# Patient Record
Sex: Female | Born: 1980 | Race: Black or African American | Hispanic: No | Smoking: Current every day smoker
Health system: Southern US, Community
[De-identification: ages and names within clinical notes are randomized; demographics above are authoritative.]

## PROBLEM LIST (undated history)

## (undated) DIAGNOSIS — R51 Headache: Secondary | ICD-10-CM

## (undated) HISTORY — PX: MOUTH SURGERY: SHX715

---
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
## 2000-01-29 ENCOUNTER — Encounter: Payer: Self-pay | Admitting: Obstetrics and Gynecology

## 2000-01-29 ENCOUNTER — Ambulatory Visit (HOSPITAL_COMMUNITY): Admission: RE | Admit: 2000-01-29 | Discharge: 2000-01-29 | Payer: Self-pay | Admitting: Obstetrics and Gynecology

## 2000-01-30 ENCOUNTER — Encounter (HOSPITAL_COMMUNITY): Admission: RE | Admit: 2000-01-30 | Discharge: 2000-02-06 | Payer: Self-pay | Admitting: Obstetrics and Gynecology

## 2000-02-02 ENCOUNTER — Encounter: Payer: Self-pay | Admitting: Obstetrics and Gynecology

## 2000-02-06 ENCOUNTER — Inpatient Hospital Stay (HOSPITAL_COMMUNITY): Admission: AD | Admit: 2000-02-06 | Discharge: 2000-02-09 | Payer: Self-pay | Admitting: Obstetrics and Gynecology

## 2000-02-06 ENCOUNTER — Encounter: Payer: Self-pay | Admitting: Obstetrics and Gynecology

## 2000-02-06 ENCOUNTER — Encounter (INDEPENDENT_AMBULATORY_CARE_PROVIDER_SITE_OTHER): Payer: Self-pay

## 2000-03-08 ENCOUNTER — Other Ambulatory Visit: Admission: RE | Admit: 2000-03-08 | Discharge: 2000-03-08 | Payer: Self-pay | Admitting: Obstetrics and Gynecology

## 2000-07-22 ENCOUNTER — Inpatient Hospital Stay (HOSPITAL_COMMUNITY): Admission: AD | Admit: 2000-07-22 | Discharge: 2000-07-22 | Payer: Self-pay | Admitting: *Deleted

## 2002-02-06 ENCOUNTER — Emergency Department (HOSPITAL_COMMUNITY): Admission: EM | Admit: 2002-02-06 | Discharge: 2002-02-06 | Payer: Self-pay | Admitting: Emergency Medicine

## 2002-02-07 ENCOUNTER — Emergency Department (HOSPITAL_COMMUNITY): Admission: EM | Admit: 2002-02-07 | Discharge: 2002-02-07 | Payer: Self-pay | Admitting: Emergency Medicine

## 2002-09-19 ENCOUNTER — Inpatient Hospital Stay (HOSPITAL_COMMUNITY): Admission: AD | Admit: 2002-09-19 | Discharge: 2002-09-19 | Payer: Self-pay | Admitting: *Deleted

## 2003-05-23 ENCOUNTER — Emergency Department (HOSPITAL_COMMUNITY): Admission: EM | Admit: 2003-05-23 | Discharge: 2003-05-23 | Payer: Self-pay | Admitting: Emergency Medicine

## 2003-09-20 ENCOUNTER — Other Ambulatory Visit: Admission: RE | Admit: 2003-09-20 | Discharge: 2003-09-20 | Payer: Self-pay | Admitting: Obstetrics and Gynecology

## 2003-11-20 ENCOUNTER — Ambulatory Visit (HOSPITAL_COMMUNITY): Admission: RE | Admit: 2003-11-20 | Discharge: 2003-11-20 | Payer: Self-pay | Admitting: Obstetrics and Gynecology

## 2004-04-05 ENCOUNTER — Inpatient Hospital Stay (HOSPITAL_COMMUNITY): Admission: AD | Admit: 2004-04-05 | Discharge: 2004-04-07 | Payer: Self-pay | Admitting: Obstetrics and Gynecology

## 2005-06-15 ENCOUNTER — Inpatient Hospital Stay (HOSPITAL_COMMUNITY): Admission: AD | Admit: 2005-06-15 | Discharge: 2005-06-15 | Payer: Self-pay | Admitting: *Deleted

## 2005-10-19 ENCOUNTER — Inpatient Hospital Stay (HOSPITAL_COMMUNITY): Admission: AD | Admit: 2005-10-19 | Discharge: 2005-10-19 | Payer: Self-pay | Admitting: Obstetrics and Gynecology

## 2006-04-25 ENCOUNTER — Emergency Department (HOSPITAL_COMMUNITY): Admission: EM | Admit: 2006-04-25 | Discharge: 2006-04-25 | Payer: Self-pay | Admitting: Emergency Medicine

## 2006-07-12 ENCOUNTER — Inpatient Hospital Stay (HOSPITAL_COMMUNITY): Admission: AD | Admit: 2006-07-12 | Discharge: 2006-07-12 | Payer: Self-pay | Admitting: Gynecology

## 2007-12-06 ENCOUNTER — Emergency Department (HOSPITAL_COMMUNITY): Admission: EM | Admit: 2007-12-06 | Discharge: 2007-12-06 | Payer: Self-pay | Admitting: Family Medicine

## 2008-07-13 ENCOUNTER — Emergency Department (HOSPITAL_COMMUNITY): Admission: EM | Admit: 2008-07-13 | Discharge: 2008-07-13 | Payer: Self-pay | Admitting: Family Medicine

## 2009-02-15 ENCOUNTER — Inpatient Hospital Stay (HOSPITAL_COMMUNITY): Admission: AD | Admit: 2009-02-15 | Discharge: 2009-02-15 | Payer: Self-pay | Admitting: Family Medicine

## 2009-03-11 ENCOUNTER — Emergency Department (HOSPITAL_COMMUNITY): Admission: EM | Admit: 2009-03-11 | Discharge: 2009-03-11 | Payer: Self-pay | Admitting: Emergency Medicine

## 2010-05-04 LAB — POCT URINALYSIS DIP (DEVICE)
Bilirubin Urine: NEGATIVE
Glucose, UA: NEGATIVE mg/dL
Hgb urine dipstick: NEGATIVE
Ketones, ur: NEGATIVE mg/dL
Nitrite: NEGATIVE
Protein, ur: NEGATIVE mg/dL
Specific Gravity, Urine: 1.01 (ref 1.005–1.030)
Urobilinogen, UA: 1 mg/dL (ref 0.0–1.0)
pH: 7 (ref 5.0–8.0)

## 2010-05-19 LAB — URINALYSIS, ROUTINE W REFLEX MICROSCOPIC
Bilirubin Urine: NEGATIVE
Glucose, UA: NEGATIVE mg/dL
Hgb urine dipstick: NEGATIVE
Ketones, ur: NEGATIVE mg/dL
Nitrite: POSITIVE — AB
Protein, ur: NEGATIVE mg/dL
Specific Gravity, Urine: 1.015 (ref 1.005–1.030)
Urobilinogen, UA: 0.2 mg/dL (ref 0.0–1.0)
pH: 6 (ref 5.0–8.0)

## 2010-05-19 LAB — POCT PREGNANCY, URINE: Preg Test, Ur: NEGATIVE

## 2010-05-19 LAB — URINE MICROSCOPIC-ADD ON

## 2010-05-19 LAB — URINE CULTURE: Colony Count: >=100000

## 2010-05-19 LAB — GC/CHLAMYDIA PROBE AMP, URINE
Chlamydia, Swab/Urine, PCR: NEGATIVE
GC Probe Amp, Urine: NEGATIVE

## 2010-07-04 NOTE — Discharge Summary (Signed)
[facility]  Patient:    Sarah Klein, Sarah Klein                    MRN: [id number] Adm. Date:  [id number] Disc. Date: 02/08/00 Attending:  Oliver Pila                           Discharge Summary  DISCHARGE DIAGNOSES:          1. Preterm pregnancy at 36 weeks, delivered.                               2. Intrauterine growth restriction.                               3. Oligohydramnios.                               4. Status post normal spontaneous vaginal                                  delivery.  DISCHARGE MEDICATIONS:        1. Motrin 600 mg p.o. q.6h.                               2. Percocet one to two tablets p.o. q.4h. p.r.n.  DISCHARGE FOLLOW-UP:          Patient is to follow up in six weeks in the office for routine postpartum examination.  HOSPITAL COURSE:              Patient is a 30 year old G1, P0 who is admitted at 36 weeks even.  Estimated due date was March 05, 2000 by her LMP consistent with a first trimester ultrasound.  Patient was admitted for induction of labor given findings of oligohydramnios with a persistent AFI of 5 as well as IUGR less than the 10th percentile by ultrasound.  Patients NSTs had been reactive, however, her amniotic fluid index failed to improve in the past week and slightly decreased.  On admission she had good fetal movement and no vaginal bleeding.  Pregnancy had been complicated by poor compliance with appointments in the office secondary to social issues.  Pregnancy also was complicated by positive marijuana screen early in pregnancy.  PRENATAL LABORATORIES:        Blood type O+.  Antibody negative.  Sickle negative.  RPR negative.  Rubella immune.  Hepatitis B surface antigen negative.  HIV negative.  GC negative.  Chlamydia negative.  PAST OBSTETRICAL HISTORY:     None.  PAST GYNECOLOGICAL HISTORY:   She had a history of herpes in her partner, however, herself has had no outbreaks.  She had a  colposcopy for an abnormal Pap smear in March 2001 which was normal.  PAST SURGICAL HISTORY:        None.  PAST MEDICAL HISTORY:         None.  SOCIAL HISTORY:               Patient is a single female, although father of the baby is involved.  Just received housing through the housing authority. She was a smoker,  however, quit with pregnancy.  PHYSICAL EXAMINATION:  VITAL SIGNS:                  Afebrile with stable vital signs.  Fetal heart rate was reactive.  CARDIAC:                      Regular rate and rhythm.  ABDOMEN:                      Gravid and nontender.  PELVIC:                       Cervix was 50% effaced, 1 cm dilated, -3.                                She was admitted for Pitocin induction.  Patient did well and when she reached 2 cm had assist of rupture of membranes with clear fluid obtained.  She received an epidural anesthesia at approximately 4 cm and secondary to some variable decelerations as well as difficulty tracing contractions, had a fetal scalp electrode and IUPC placed.  With those in place the patient progressed well to complete and pushed well with a normal spontaneous vaginal delivery of a vigorous female infant over an intact perineum.  Apgars were 8 and 9.  Weight was 4 pounds 12 ounces.  Placenta delivered spontaneously.  There was a small left periclitoral/labial laceration which reapproximated with 3-0 Vicryl in interrupted sutures.  EBL was 300 cc.  Cervix and rectum were intact.  Patient was then admitted for routine postpartum care and did very well.  She remained afebrile.  On postpartum day #1 was doing very well, breast-feeding without difficulty.  The fundus was firm.  The pediatrician had given her the okay for discharge with the baby to follow up at pediatric office in the next few days. DD:  02/08/00 TD:  02/08/00 Job: [id number]

## 2010-07-04 NOTE — H&P (Signed)
Sarah Klein, GUARDIOLA NO.:  [id number]   MEDICAL RECORD NO.:  [id number]          PATIENT TYPE:  INP   LOCATION:  9163                          FACILITY:  [facility]   PHYSICIAN:  Hal Morales, M.D.DATE OF BIRTH:  1981/02/13   DATE OF ADMISSION:  04/05/2004  DATE OF DISCHARGE:                                HISTORY & PHYSICAL   HISTORY OF PRESENT ILLNESS:  This is a 30 year old gravida 3, para 1-0-1-1  at 39-1/7 weeks who presents in active labor, status post rupture of  membranes at 4 a.m. with clear fluid.  Her pregnancy has been followed by  the nurse midwife service and remarkable for:  #1 - History of  oligohydramnios, #2 - group B strep positive.   OBSTETRICAL HISTORY:  OB history is remarkable for vaginal delivery in 2001  of a female infant at 22 weeks' gestation weighing 4 pounds 12 ounces,  remarkable for oligohydramnios.  She had a spontaneous abortion in 2004 with  no complications.   MEDICAL HISTORY:  Medical history is remarkable for:  1.  Oligohydramnios with her first baby.  2.  Trichomonas in the first trimester of this pregnancy.  3.  Childhood varicella.  4.  History of migraines.   FAMILY HISTORY:  Family history is remarkable for a great-grandmother with  hypertension and diabetes and stroke.   SURGICAL HISTORY:  Negative.   GENETIC HISTORY:  Negative.   SOCIAL HISTORY:  The patient is single.  Father of the baby, Alfonse Flavors,  is involved and supportive.  She is a Consulting civil engineer.  She does not report a  religious affiliation.  She denies any alcohol, tobacco or drug use.   PRENATAL LABORATORIES:  Hemoglobin 11.8, platelets 234,000.  Blood type O  positive, antibody screen negative.  Sickle cell negative.  RPR nonreactive.  Rubella immune.  Hepatitis negative.  HIV negative.  Cystic fibrosis  declined.  Group B strep positive.   HISTORY OF CURRENT PREGNANCY:  The patient entered care at 10 weeks'  gestation.  She declined her quadruple  screen.  She had a normal ultrasound.  She was treated for a urinary tract infection at 16 weeks.  She had positive  group B strep at term.  Ultrasound at term showed 67-68 percentile growth  with normal AFI.   OBJECTIVE DATA:  VITAL SIGNS:  Stable, afebrile.  HEENT:  Within normal limits.  NECK:  Thyroid normal, not enlarged.  CHEST:  Clear to auscultation.  HEART:  Heart rate regular rate and rhythm.  ABDOMEN:  Abdomen gravid at 38 cm, vertex to Leopold's.  EFM shows  reassuring fetal heart rate with contractions every 2-4 minutes.  PELVIC:  There is gross pooling of clear fluid.  Cervix is 5.5 cm,  completely effaced and -1 station, vertex presentation.  EXTREMITIES:  Within normal limits.   ASSESSMENT:  1.  Intrauterine pregnancy at 39-1/7 weeks.  2.  Active labor.  3.  Spontaneous rupture of membranes.  4.  Group B streptococcus positive.   PLAN:  1.  Admit to birthing suite, Dr. [facility] notified.  2.  Routine  C.N.M. orders.  3.  Stadol and epidural.  4.  Group B strep prophylaxis.      MLW/MEDQ  D:  04/05/2004  T:  04/05/2004  Job:  [id number]

## 2010-07-04 NOTE — Discharge Summary (Signed)
[facility]  Patient:    Sarah Klein, Sarah Klein                    MRN: [id number] Adm. Date:  [id number] Disc. Date: [id number] Attending:  Oliver Pila                           Discharge Summary  ADDENDUM  This is an addendum to the previously dictated Discharge Summary dictated on December 23 by Alvino Chapel, M.D..  The patient was not discharged on December 23 as her baby was not stable for discharge; so the patient stayed in the hospital.  She had no significant complications, remained afebrile with stable vital signs, breast fed her baby without problems and, on the morning of December 24, was stable for discharge home.  On December 24, she is discharged home as per her previous Discharge Summary. DD:  02/09/00 TD:  02/10/00 Job: [id number]

## 2010-12-07 ENCOUNTER — Emergency Department (HOSPITAL_COMMUNITY)
Admission: EM | Admit: 2010-12-07 | Discharge: 2010-12-07 | Disposition: A | Discharge status: Other Acute Inpatient Hospital | Payer: No Typology Code available for payment source | Attending: Emergency Medicine | Admitting: Emergency Medicine

## 2010-12-07 DIAGNOSIS — IMO0002 Reserved for concepts with insufficient information to code with codable children: Secondary | ICD-10-CM | POA: Insufficient documentation

## 2010-12-07 DIAGNOSIS — N949 Unspecified condition associated with female genital organs and menstrual cycle: Secondary | ICD-10-CM | POA: Insufficient documentation

## 2010-12-08 LAB — POCT PREGNANCY, URINE: Preg Test, Ur: NEGATIVE

## 2011-12-26 ENCOUNTER — Emergency Department (HOSPITAL_COMMUNITY): Payer: Self-pay

## 2011-12-26 ENCOUNTER — Emergency Department (HOSPITAL_COMMUNITY)
Admission: EM | Admit: 2011-12-26 | Discharge: 2011-12-26 | Disposition: A | Discharge status: Home Health | Payer: Self-pay | Attending: Emergency Medicine | Admitting: Emergency Medicine

## 2011-12-26 ENCOUNTER — Encounter (HOSPITAL_COMMUNITY): Payer: Self-pay | Admitting: Physical Medicine and Rehabilitation

## 2011-12-26 DIAGNOSIS — J40 Bronchitis, not specified as acute or chronic: Secondary | ICD-10-CM | POA: Insufficient documentation

## 2011-12-26 DIAGNOSIS — F17200 Nicotine dependence, unspecified, uncomplicated: Secondary | ICD-10-CM | POA: Insufficient documentation

## 2011-12-26 LAB — RAPID STREP SCREEN (MED CTR MEBANE ONLY): Streptococcus, Group A Screen (Direct): NEGATIVE

## 2011-12-26 MED ORDER — ALBUTEROL SULFATE HFA 108 (90 BASE) MCG/ACT IN AERS
1.0000 | INHALATION_SPRAY | RESPIRATORY_TRACT | Status: DC | PRN
  Administered 2011-12-26: 1 via RESPIRATORY_TRACT
  Filled 2011-12-26: qty 6.7

## 2011-12-26 MED ORDER — ALBUTEROL SULFATE (5 MG/ML) 0.5% IN NEBU
5.0000 mg | INHALATION_SOLUTION | Freq: Once | RESPIRATORY_TRACT | Status: AC
  Administered 2011-12-26: 5 mg via RESPIRATORY_TRACT
  Filled 2011-12-26: qty 1

## 2011-12-26 MED ORDER — IPRATROPIUM BROMIDE 0.02 % IN SOLN
0.5000 mg | Freq: Once | RESPIRATORY_TRACT | Status: AC
  Administered 2011-12-26: 0.5 mg via RESPIRATORY_TRACT
  Filled 2011-12-26: qty 2.5

## 2011-12-26 NOTE — ED Notes (Signed)
Patient transported to X-ray 

## 2011-12-26 NOTE — ED Notes (Signed)
Pt states she feels "much better" 

## 2011-12-26 NOTE — ED Provider Notes (Addendum)
History    This chart was scribed for Sarah Sprout, MD, MD by Smitty Pluck, ED Scribe. The patient was seen in room TR07C and the patient's care was started at 12:52PM.   CSN: [id number]  Arrival date & time 12/26/11  1222     Chief Complaint  Patient presents with  . Cough  . Shortness of Breath    (Consider location/radiation/quality/duration/timing/severity/associated sxs/prior treatment) Patient is a 31 y.o. female presenting with cough and shortness of breath. The history is provided by the patient. No language interpreter was used.  Cough Associated symptoms include shortness of breath. Pertinent negatives include no chills.  Shortness of Breath  Associated symptoms include cough and shortness of breath. Pertinent negatives include no fever.   [name] is a 31 y.o. female who presents to the Emergency Department complaining of constant, moderate non-productive cough onset 2 days ago. Pt reports having wheezing that keeps her awake at night. She states that she has chest congestion. Denies fever and any other pain. Pt smokes cigarettes.   No past medical history on file.  No past surgical history on file.  No family history on file.  History  Substance Use Topics  . Smoking status: Current Every Day Smoker    Types: Cigarettes  . Smokeless tobacco: Not on file  . Alcohol Use: Yes     Comment: occ    OB History    Grav Para Term Preterm Abortions TAB SAB Ect Mult Living                  Review of Systems  Constitutional: Negative for fever and chills.  Respiratory: Positive for cough and shortness of breath.   Gastrointestinal: Negative for nausea and vomiting.  Neurological: Negative for weakness.  All other systems reviewed and are negative.    Allergies  Review of patient's allergies indicates no known allergies.  Home Medications  No current outpatient prescriptions on file.  BP 142/78  Pulse 101  Temp 98.5 F (36.9 C) (Oral)  Resp  18  SpO2 97%  Physical Exam  Nursing note and vitals reviewed. Constitutional: She is oriented to person, place, and time. She appears well-developed and well-nourished. No distress.  HENT:  Head: Normocephalic and atraumatic.  Right Ear: External ear normal.  Left Ear: External ear normal.  Mouth/Throat: Oropharynx is clear and moist.  Eyes: EOM are normal.  Neck: Neck supple. No tracheal deviation present.  Cardiovascular: Tachycardia present.   Pulmonary/Chest: Effort normal. No respiratory distress. She has wheezes (in all fields).  Musculoskeletal: Normal range of motion.  Neurological: She is alert and oriented to person, place, and time.  Skin: Skin is warm and dry.  Psychiatric: She has a normal mood and affect. Her behavior is normal.    ED Course  Procedures (including critical care time) DIAGNOSTIC STUDIES: Oxygen Saturation is 97% on room air, normal by my interpretation.    COORDINATION OF CARE: 12:53 PM Discussed ED treatment with pt     Labs Reviewed - No data to display Dg Chest 2 View  12/26/2011  *RADIOLOGY REPORT*  Clinical Data: Cough, shortness of breath  CHEST - 2 VIEW  Comparison: None.  Findings:  Cardiomediastinal silhouette is unremarkable.  No acute infiltrate or pleural effusion.  No pulmonary edema.  Bony thorax is unremarkable.  IMPRESSION: No active disease.   Original Report Authenticated By: Natasha Mead, M.D.      1. Bronchitis       MDM   Pt  with symptoms consistent with viral URI with wheezing.  Well appearing here.   No signs of pharyngitis, otitis or abnormal abdominal findings.  Albuterol/atrovent given for wheezing. CXR pending and will recheck.  2:40 PM CXR wnl.  D/ced home with inhaler    I personally performed the services described in this documentation, which was scribed in my presence.  The recorded information has been reviewed and considered.    Sarah Sprout, MD 12/26/11 1304  Sarah Sprout, MD 12/26/11  1441

## 2011-12-26 NOTE — ED Notes (Signed)
C/o non productive cough times 3 days with soreness in her chest

## 2011-12-26 NOTE — ED Notes (Signed)
Pt presents to department for evaluation of SOB, cough and chest congestion. Ongoing x2 days. Respirations unlabored. Denies fever. Denies pain at the time. She is alert and oriented x4. No signs of acute distress noted.

## 2011-12-26 NOTE — ED Notes (Signed)
States she feels better after [facility]

## 2011-12-26 NOTE — ED Notes (Signed)
Pt completing HHN.

## 2012-06-10 ENCOUNTER — Encounter (HOSPITAL_COMMUNITY): Payer: Self-pay | Admitting: Emergency Medicine

## 2012-06-10 ENCOUNTER — Emergency Department (HOSPITAL_COMMUNITY)
Admission: EM | Admit: 2012-06-10 | Discharge: 2012-06-11 | Disposition: A | Discharge status: Home / Self Care | Payer: Medicaid Other | Attending: Emergency Medicine | Admitting: Emergency Medicine

## 2012-06-10 DIAGNOSIS — Z202 Contact with and (suspected) exposure to infections with a predominantly sexual mode of transmission: Secondary | ICD-10-CM

## 2012-06-10 DIAGNOSIS — Z3202 Encounter for pregnancy test, result negative: Secondary | ICD-10-CM | POA: Insufficient documentation

## 2012-06-10 DIAGNOSIS — F17200 Nicotine dependence, unspecified, uncomplicated: Secondary | ICD-10-CM | POA: Insufficient documentation

## 2012-06-10 LAB — URINALYSIS, ROUTINE W REFLEX MICROSCOPIC
Bilirubin Urine: NEGATIVE
Glucose, UA: NEGATIVE mg/dL
Hgb urine dipstick: NEGATIVE
Ketones, ur: NEGATIVE mg/dL
Leukocytes, UA: NEGATIVE
Nitrite: POSITIVE — AB
Protein, ur: NEGATIVE mg/dL
Specific Gravity, Urine: 1.029 (ref 1.005–1.030)
Urobilinogen, UA: 0.2 mg/dL (ref 0.0–1.0)
pH: 5.5 (ref 5.0–8.0)

## 2012-06-10 LAB — POCT PREGNANCY, URINE: Preg Test, Ur: NEGATIVE

## 2012-06-10 LAB — URINE MICROSCOPIC-ADD ON

## 2012-06-10 MED ORDER — CEFTRIAXONE SODIUM 250 MG IJ SOLR
250.0000 mg | Freq: Once | INTRAMUSCULAR | Status: AC
  Administered 2012-06-11: 250 mg via INTRAMUSCULAR
  Filled 2012-06-10: qty 250

## 2012-06-10 MED ORDER — AZITHROMYCIN 250 MG PO TABS
1000.0000 mg | ORAL_TABLET | Freq: Once | ORAL | Status: AC
  Administered 2012-06-11: 1000 mg via ORAL
  Filled 2012-06-10: qty 4

## 2012-06-10 NOTE — ED Notes (Signed)
Pt states she is here with female partner. Partner admitted to her he is having penile discharge. Pt would like to be checked for UTI, pt states she experienced L flank pain yesterday. Denies GYN or urinary s/s.

## 2012-06-10 NOTE — ED Provider Notes (Signed)
History  This chart was scribed for non-physician practitioner Jaci Carrel, PA-C working with Celene Kras, MD, by Candelaria Stagers, ED Scribe. This patient was seen in room WTR5/WTR5 and the patient's care was started at 11:18 PM   CSN: [id number]  Arrival date & time 06/10/12  2243   First MD Initiated Contact with Patient 06/10/12 2313      Chief Complaint  Patient presents with  . Exposure to STD    The history is provided by the patient. No language interpreter was used.   Sarah Klein is a 32 y.o. female who presents to the Emergency Department pt reports unprotected intercourse with pt who likely has STD (was treated for yellow/green penile discharge).  Sexual partner is being treated for gonorrhea and chlamydia.  Pt denies any current sx including abdominal pain, back pain, discharge, dysuria, nausea, vomiting, or dyspareunia   History reviewed. No pertinent past medical history.  History reviewed. No pertinent past surgical history.  No family history on file.  History  Substance Use Topics  . Smoking status: Current Every Day Smoker    Types: Cigarettes  . Smokeless tobacco: Not on file  . Alcohol Use: Yes     Comment: occ    OB History   Grav Para Term Preterm Abortions TAB SAB Ect Mult Living                  Review of Systems ROS as mentioned in HPI.   Allergies  Review of patient's allergies indicates no known allergies.  Home Medications   Current Outpatient Rx  Name  Route  Sig  Dispense  Refill  . acetaminophen (TYLENOL) 500 MG tablet   Oral   Take 1,000 mg by mouth every 6 (six) hours as needed for pain (pain).           BP 127/65  Pulse 83  Temp(Src) 98.2 F (36.8 C) (Oral)  Resp 18  Ht 5\' 5"  (1.651 m)  Wt 126 lb (57.153 kg)  BMI 20.97 kg/m2  SpO2 100%  LMP 05/20/2012  Physical Exam  Nursing note and vitals reviewed. Constitutional: She is oriented to person, place, and time. She appears well-developed and well-nourished. No  distress.  HENT:  Head: Normocephalic and atraumatic.  Eyes: Conjunctivae and EOM are normal.  Neck: Normal range of motion.  Pulmonary/Chest: Effort normal.  Abdominal:  Soft nontender abdomen   Genitourinary:  Pelvic exam deferred  Musculoskeletal: Normal range of motion.  Neurological: She is alert and oriented to person, place, and time.  Skin: Skin is warm and dry. No rash noted. She is not diaphoretic.  Psychiatric: She has a normal mood and affect. Her behavior is normal.    ED Course  Procedures  DIAGNOSTIC STUDIES: Oxygen Saturation is 100% on room air, normal by my interpretation.    COORDINATION OF CARE:  11:19 PM Discussed course of care with pt which includes treatment for gonorrhea and chlamydia.  Pt understands and agrees.    Labs Reviewed  URINALYSIS, ROUTINE W REFLEX MICROSCOPIC  POCT PREGNANCY, URINE   No results found.   No diagnosis found.    MDM  STD interaction Patient to be discharged with instructions to follow up with OBGYN. Discussed importance of using protection when sexually active. Pt understands that they have GC/Chlamydia cultures pending and that they will need to inform all sexual partners if results return positive. Pt has been treated prophylacticly with azithromycin and rocephin due to pts history.  Pelvic  deferred.  Pt has been advised to not drink alcohol while on this medication.   I personally performed the services described in this documentation, which was scribed in my presence. The recorded information has been reviewed and is accurate.           Jaci Carrel, [place] 06/10/12 2330

## 2012-06-11 NOTE — ED Provider Notes (Signed)
Medical screening examination/treatment/procedure(s) were performed by non-physician practitioner and as supervising physician I was immediately available for consultation/collaboration.     Celene Kras, MD 06/11/12 [phone]

## 2012-11-10 ENCOUNTER — Encounter (HOSPITAL_COMMUNITY): Payer: Self-pay | Admitting: *Deleted

## 2012-11-10 ENCOUNTER — Inpatient Hospital Stay (HOSPITAL_COMMUNITY)
Admission: AD | Admit: 2012-11-10 | Discharge: 2012-11-10 | Disposition: A | Discharge status: Home / Self Care | Payer: Medicaid Other | Source: Ambulatory Visit | Attending: Obstetrics & Gynecology | Admitting: Obstetrics & Gynecology

## 2012-11-10 DIAGNOSIS — Z3201 Encounter for pregnancy test, result positive: Secondary | ICD-10-CM | POA: Insufficient documentation

## 2012-11-10 HISTORY — DX: Headache: R51

## 2012-11-10 LAB — POCT PREGNANCY, URINE: Preg Test, Ur: POSITIVE — AB

## 2012-11-10 NOTE — MAU Provider Note (Signed)
History     CSN: [id number]  Arrival date and time: 11/10/12 0825   None     Chief Complaint  Patient presents with  . Possible Pregnancy   HPI Pt is pregnant- took a home pregnancy test last night .  Pt has dental surgery and is taking Amoxicillin. She denies spotting bleeding or cramping.  Pt is not sure where she wants to go for prenatal care.  Pt denies nausea or vomiting.  Pt needs confirmation of pregnancy RN note:Pt had some dental procedures and a dental surgery before she knew she was pregnant and is concerned about her fetus; UPT was positive today and by LNMP, she would be [redacted] weeks gestation Patient states she had a positive home pregnancy test last night. Patient wants confirmation and to speak to someone about dental problems and her exposure to Xrays with the procedure. Has been on medication. Last period was 8-7 but had 2 days of light bleeding on 9-18.Patient denies bleeding, discharge, pain, nausea or vomiting   Past Medical History  Diagnosis Date  . [id number](478.2)     Past Surgical History  Procedure Laterality Date  . Mouth surgery      Family History  Problem Relation Age of Onset  . Diabetes Maternal Grandmother     History  Substance Use Topics  . Smoking status: Current Every Day Smoker -- 1.00 packs/day    Types: Cigarettes  . Smokeless tobacco: Not on file  . Alcohol Use: Yes     Comment: occ    Allergies: No Known Allergies  Prescriptions prior to admission  Medication Sig Dispense Refill  . amoxicillin (AMOXIL) 500 MG capsule Take 500 mg by mouth 3 (three) times daily. X 7 days      . HYDROcodone-acetaminophen (NORCO/VICODIN) 5-325 MG per tablet Take 1 tablet by mouth every 4 (four) hours as needed for pain.        Review of Systems  Constitutional: Negative for fever and chills.  Gastrointestinal: Negative for heartburn, vomiting, abdominal pain and diarrhea.  Genitourinary: Negative for dysuria and urgency.   Physical Exam    Blood pressure 118/72, pulse 77, temperature 98.3 F (36.8 C), temperature source Oral, resp. rate 18, height 5\' 6"  (1.676 m), weight 58.242 kg (128 lb 6.4 oz), last menstrual period 09/22/2012.  Physical Exam  Nursing note and vitals reviewed. Constitutional: She is oriented to person, place, and time. She appears well-developed and well-nourished.  HENT:  Head: Normocephalic.  Eyes: Pupils are equal, round, and reactive to light.  Neck: Normal range of motion. Neck supple.  Respiratory: Effort normal.  GI: Soft.  Musculoskeletal: Normal range of motion.  Neurological: She is alert and oriented to person, place, and time.  Skin: Skin is warm and dry.  Psychiatric: She has a normal mood and affect.    MAU Course  Procedures Results for orders placed during the hospital encounter of 11/10/12 (from the past 72 hour(s))  POCT PREGNANCY, URINE     Status: Abnormal   Collection Time    11/10/12  9:05 AM      Result Value Range   Preg Test, Ur POSITIVE (*) NEGATIVE   Comment:            THE SENSITIVITY OF THIS     METHODOLOGY IS >24 mIU/mL   Told pt she may continue Amoxicillin for dental   Assessment and Plan  Positive pregnancy test ABCs of pregnancy Confirmation of pregnancy F/u for OB care Return sooner if bleeding  or cramping  Bartlomiej Jenkinson 11/10/2012, 9:32 AM

## 2012-11-10 NOTE — MAU Note (Signed)
Patient states she had a positive home pregnancy test last night. Patient wants confirmation and to speak to someone about dental problems and her exposure to Xrays with the procedure. Has been on medication. Last period was 8-7 but had 2 days of light bleeding on 9-18.Patient denies bleeding, discharge, pain, nausea or vomiting.

## 2012-11-10 NOTE — Discharge Instructions (Signed)
ABCs of Pregnancy A Antepartum care is very important. Be sure you see your doctor and get prenatal care as soon as you think you are pregnant. At this time, you will be tested for infection, genetic abnormalities and potential problems with you and the pregnancy. This is the time to discuss diet, exercise, work, medications, labor, pain medication during labor and the possibility of a cesarean delivery. Ask any questions that may concern you. It is important to see your doctor regularly throughout your pregnancy. Avoid exposure to toxic substances and chemicals - such as cleaning solvents, lead and mercury, some insecticides, and paint. Pregnant women should avoid exposure to paint fumes, and fumes that cause you to feel ill, dizzy or faint. When possible, it is a good idea to have a pre-pregnancy consultation with your caregiver to begin some important recommendations your caregiver suggests such as, taking folic acid, exercising, quitting smoking, avoiding alcoholic beverages, etc. B Breastfeeding is the healthiest choice for both you and your baby. It has many nutritional benefits for the baby and health benefits for the mother. It also creates a very tight and loving bond between the baby and mother. Talk to your doctor, your family and friends, and your employer about how you choose to feed your baby and how they can support you in your decision. Not all birth defects can be prevented, but a woman can take actions that may increase her chance of having a healthy baby. Many birth defects happen very early in pregnancy, sometimes before a woman even knows she is pregnant. Birth defects or abnormalities of any child in your or the father's family should be discussed with your caregiver. Get a good support bra as your breast size changes. Wear it especially when you exercise and when nursing.  C Celebrate the news of your pregnancy with the your spouse/father and family. Childbirth classes are helpful to  take for you and the spouse/father because it helps to understand what happens during the pregnancy, labor and delivery. Cesarean delivery should be discussed with your doctor so you are prepared for that possibility. The pros and cons of circumcision if it is a boy, should be discussed with your pediatrician. Cigarette smoking during pregnancy can result in low birth weight babies. It has been associated with infertility, miscarriages, tubal pregnancies, infant death (mortality) and poor health (morbidity) in childhood. Additionally, cigarette smoking may cause long-term learning disabilities. If you smoke, you should try to quit before getting pregnant and not smoke during the pregnancy. Secondary smoke may also harm a mother and her developing baby. It is a good idea to ask people to stop smoking around you during your pregnancy and after the baby is born. Extra calcium is necessary when you are pregnant and is found in your prenatal vitamin, in dairy products, green leafy vegetables and in calcium supplements. D A healthy diet according to your current weight and height, along with vitamins and mineral supplements should be discussed with your caregiver. Domestic abuse or violence should be made known to your doctor right away to get the situation corrected. Drink more water when you exercise to keep hydrated. Discomfort of your back and legs usually develops and progresses from the middle of the second trimester through to delivery of the baby. This is because of the enlarging baby and uterus, which may also affect your balance. Do not take illegal drugs. Illegal drugs can seriously harm the baby and you. Drink extra fluids (water is best) throughout pregnancy to help  your body keep up with the increases in your blood volume. Drink at least 6 to 8 glasses of water, fruit juice, or milk each day. A good way to know you are drinking enough fluid is when your urine looks almost like clear water or is very light  yellow.  E Eat healthy to get the nutrients you and your unborn baby need. Your meals should include the five basic food groups. Exercise (30 minutes of light to moderate exercise a day) is important and encouraged during pregnancy, if there are no medical problems or problems with the pregnancy. Exercise that causes discomfort or dizziness should be stopped and reported to your caregiver. Emotions during pregnancy can change from being ecstatic to depression and should be understood by you, your partner and your family. F Fetal screening with ultrasound, amniocentesis and monitoring during pregnancy and labor is common and sometimes necessary. Take 400 micrograms of folic acid daily both before, when possible, and during the first few months of pregnancy to reduce the risk of birth defects of the brain and spine. All women who could possibly become pregnant should take a vitamin with folic acid, every day. It is also important to eat a healthy diet with fortified foods (enriched grain products, including cereals, rice, breads, and pastas) and foods with natural sources of folate (orange juice, green leafy vegetables, beans, peanuts, broccoli, asparagus, peas, and lentils). The father should be involved with all aspects of the pregnancy including, the prenatal care, childbirth classes, labor, delivery, and postpartum time. Fathers may also have emotional concerns about being a father, financial needs, and raising a family. G Genetic testing should be done appropriately. It is important to know your family and the father's history. If there have been problems with pregnancies or birth defects in your family, report these to your doctor. Also, genetic counselors can talk with you about the information you might need in making decisions about having a family. You can call a major medical center in your area for help in finding a board-certified genetic counselor. Genetic testing and counseling should be done  before pregnancy when possible, especially if there is a history of problems in the mother's or father's family. Certain ethnic backgrounds are more at risk for genetic defects. H Get familiar with the hospital where you will be having your baby. Get to know how long it takes to get there, the labor and delivery area, and the hospital procedures. Be sure your medical insurance is accepted there. Get your home ready for the baby including, clothes, the baby's room (when possible), furniture and car seat. Hand washing is important throughout the day, especially after handling raw meat and poultry, changing the baby's diaper or using the bathroom. This can help prevent the spread of many bacteria and viruses that cause infection. Your hair may become dry and thinner, but will return to normal a few weeks after the baby is born. Heartburn is a common problem that can be treated by taking antacids recommended by your caregiver, eating smaller meals 5 or 6 times a day, not drinking liquids when eating, drinking between meals and raising the head of your bed 2 to 3 inches. I Insurance to cover you, the baby, doctor and hospital should be reviewed so that you will be prepared to pay any costs not covered by your insurance plan. If you do not have medical insurance, there are usually clinics and services available for you in your community. Take 30 milligrams of iron during  your pregnancy as prescribed by your doctor to reduce the risk of low red blood cells (anemia) later in pregnancy. All women of childbearing age should eat a diet rich in iron. J There should be a joint effort for the mother, father and any other children to adapt to the pregnancy financially, emotionally, and psychologically during the pregnancy. Join a support group for moms-to-be. Or, join a class on parenting or childbirth. Have the family participate when possible. K Know your limits. Let your caregiver know if you experience any of the  following:   Pain of any kind.  Strong cramps.  You develop a lot of weight in a short period of time (5 pounds in 3 to 5 days).  Vaginal bleeding, leaking of amniotic fluid.  Headache, vision problems.  Dizziness, fainting, shortness of breath.  Chest pain.  Fever of 102 F (38.9 C) or higher.  Gush of clear fluid from your vagina.  Painful urination.  Domestic violence.  Irregular heartbeat (palpitations).  Rapid beating of the heart (tachycardia).  Constant feeling sick to your stomach (nauseous) and vomiting.  Trouble walking, fluid retention (edema).  Muscle weakness.  If your baby has decreased activity.  Persistent diarrhea.  Abnormal vaginal discharge.  Uterine contractions at 20-minute intervals.  Back pain that travels down your leg. L Learn and practice that what you eat and drink should be in moderation and healthy for you and your baby. Legal drugs such as alcohol and caffeine are important issues for pregnant women. There is no safe amount of alcohol a woman can drink while pregnant. Fetal alcohol syndrome, a disorder characterized by growth retardation, facial abnormalities, and central nervous system dysfunction, is caused by a woman's use of alcohol during pregnancy. Caffeine, found in tea, coffee, soft drinks and chocolate, should also be limited. Be sure to read labels when trying to cut down on caffeine during pregnancy. More than 200 foods, beverages, and over-the-counter medications contain caffeine and have a high salt content! There are coffees and teas that do not contain caffeine. M Medical conditions such as diabetes, epilepsy, and high blood pressure should be treated and kept under control before pregnancy when possible, but especially during pregnancy. Ask your caregiver about any medications that may need to be changed or adjusted during pregnancy. If you are currently taking any medications, ask your caregiver if it is safe to take them  while you are pregnant or before getting pregnant when possible. Also, be sure to discuss any herbs or vitamins you are taking. They are medicines, too! Discuss with your doctor all medications, prescribed and over-the-counter, that you are taking. During your prenatal visit, discuss the medications your doctor may give you during labor and delivery. N Never be afraid to ask your doctor or caregiver questions about your health, the progress of the pregnancy, family problems, stressful situations, and recommendation for a pediatrician, if you do not have one. It is better to take all precautions and discuss any questions or concerns you may have during your office visits. It is a good idea to write down your questions before you visit the doctor. O Over-the-counter cough and cold remedies may contain alcohol or other ingredients that should be avoided during pregnancy. Ask your caregiver about prescription, herbs or over-the-counter medications that you are taking or may consider taking while pregnant.  P Physical activity during pregnancy can benefit both you and your baby by lessening discomfort and fatigue, providing a sense of well-being, and increasing the likelihood of  early recovery after delivery. Light to moderate exercise during pregnancy strengthens the belly (abdominal) and back muscles. This helps improve posture. Practicing yoga, walking, swimming, and cycling on a stationary bicycle are usually safe exercises for pregnant women. Avoid scuba diving, exercise at high altitudes (over 3000 feet), skiing, horseback riding, contact sports, etc. Always check with your doctor before beginning any kind of exercise, especially during pregnancy and especially if you did not exercise before getting pregnant. Q Queasiness, stomach upset and morning sickness are common during pregnancy. Eating a couple of crackers or dry toast before getting out of bed. Foods that you normally love may make you feel sick to  your stomach. You may need to substitute other nutritious foods. Eating 5 or 6 small meals a day instead of 3 large ones may make you feel better. Do not drink with your meals, drink between meals. Questions that you have should be written down and asked during your prenatal visits. R Read about and make plans to baby-proof your home. There are important tips for making your home a safer environment for your baby. Review the tips and make your home safer for you and your baby. Read food labels regarding calories, salt and fat content in the food. S Saunas, hot tubs, and steam rooms should be avoided while you are pregnant. Excessive high heat may be harmful during your pregnancy. Your caregiver will screen and examine you for sexually transmitted diseases and genetic disorders during your prenatal visits. Learn the signs of labor. Sexual relations while pregnant is safe unless there is a medical or pregnancy problem and your caregiver advises against it. T Traveling long distances should be avoided especially in the third trimester of your pregnancy. If you do have to travel out of state, be sure to take a copy of your medical records and medical insurance plan with you. You should not travel long distances without seeing your doctor first. Most airlines will not allow you to travel after 36 weeks of pregnancy. Toxoplasmosis is an infection caused by a parasite that can seriously harm an unborn baby. Avoid eating undercooked meat and handling cat litter. Be sure to wear gloves when gardening. Tingling of the hands and fingers is not unusual and is due to fluid retention. This will go away after the baby is born. U Womb (uterus) size increases during the first trimester. Your kidneys will begin to function more efficiently. This may cause you to feel the need to urinate more often. You may also leak urine when sneezing, coughing or laughing. This is due to the growing uterus pressing against your bladder,  which lies directly in front of and slightly under the uterus during the first few months of pregnancy. If you experience burning along with frequency of urination or bloody urine, be sure to tell your doctor. The size of your uterus in the third trimester may cause a problem with your balance. It is advisable to maintain good posture and avoid wearing high heels during this time. An ultrasound of your baby may be necessary during your pregnancy and is safe for you and your baby. V Vaccinations are an important concern for pregnant women. Get needed vaccines before pregnancy. Center for Disease Control ([employer]com.brwww.cdc.gov) has clear guidelines for the use of vaccines during pregnancy. Review the list, be sure to discuss it with your doctor. Prenatal vitamins are helpful and healthy for you and the baby. Do not take extra vitamins except what is recommended. Taking too much of certain  vitamins can cause overdose problems. Continuous vomiting should be reported to your caregiver. Varicose veins may appear especially if there is a family history of varicose veins. They should subside after the delivery of the baby. Support hose helps if there is leg discomfort. W Being overweight or underweight during pregnancy may cause problems. Try to get within 15 pounds of your ideal weight before pregnancy. Remember, pregnancy is not a time to be dieting! Do not stop eating or start skipping meals as your weight increases. Both you and your baby need the calories and nutrition you receive from a healthy diet. Be sure to consult with your doctor about your diet. There is a formula and diet plan available depending on whether you are overweight or underweight. Your caregiver or nutritionist can help and advise you if necessary. X Avoid X-rays. If you must have dental work or diagnostic tests, tell your dentist or physician that you are pregnant so that extra care can be taken. X-rays should only be taken when the risks of not taking  them outweigh the risk of taking them. If needed, only the minimum amount of radiation should be used. When X-rays are necessary, protective lead shields should be used to cover areas of the body that are not being X-rayed. Y Your baby loves you. Breastfeeding your baby creates a loving and very close bond between the two of you. Give your baby a healthy environment to live in while you are pregnant. Infants and children require constant care and guidance. Their health and safety should be carefully watched at all times. After the baby is born, rest or take a nap when the baby is sleeping. Z Get your ZZZs. Be sure to get plenty of rest. Resting on your side as often as possible, especially on your left side is advised. It provides the best circulation to your baby and helps reduce swelling. Try taking a nap for 30 to 45 minutes in the afternoon when possible. After the baby is born rest or take a nap when the baby is sleeping. Try elevating your feet for that amount of time when possible. It helps the circulation in your legs and helps reduce swelling.  Most information courtesy of the CDC. Document Released: 02/02/2005 Document Revised: 04/27/2011 Document Reviewed: 10/17/2008 Exit[employer] Patient Information 2014 [place] [employer].    ________________________________________     To schedule your Maternity Eligibility Appointment, please call [phone].  When you arrive for your appointment you must bring the following items or information listed below.  Your appointment will be rescheduled if you do not have these items or are 15 minutes late. If currently receiving Medicaid, you MUST bring: 1. Medicaid Card 2. Social Security Card 3. Picture ID 4. Proof of Pregnancy 5. Verification of current address if the address on Medicaid card is incorrect "postmarked mail" If not receiving Medicaid, you MUST bring: 1. Social Security Card 2. Picture ID 3. Birth Certificate (if available) Passport or  *Green Card 4. Proof of Pregnancy 5. Verification of current address "postmarked mail" for each income presented. 6. Verification of insurance coverage, if any 7. Check stubs from each employer for the previous month (if unable to present check stub  for each week, we will accept check stub for the first and last week ill the same month.) If you can't locate check stubs, you must bring a letter from the employer(s) and it must have the following information on letterhead, typed, in English: o name of company o company telephone number  o how long been with the company, if less than one month o how much person earns per hour o how many hours per week work o the gross pay the person earned for the previous month If you are 32 years old or less, you do not have to bring proof of income unless you work or live with the father of the baby and at that time we will need proof of income from you and/or the father of the baby. Green Card recipients are eligible for Medicaid for Pregnant Women (MPW)

## 2012-11-10 NOTE — MAU Note (Signed)
Pt had some dental procedures and a dental surgery before she knew she was pregnant and is concerned about her fetus; UPT was positive today and by LNMP, she would be [redacted] weeks gestation;

## 2012-11-14 NOTE — MAU Provider Note (Signed)
Attestation of Attending Supervision of Advanced Practitioner (PA/CNM/NP): Evaluation and management procedures were performed by the Advanced Practitioner under my supervision and collaboration.  I have reviewed the Advanced Practitioner's note and chart, and I agree with the management and plan.  Marquise Wicke, MD, FACOG Attending Obstetrician & Gynecologist Faculty Practice, [facility]  

## 2012-11-28 ENCOUNTER — Other Ambulatory Visit: Payer: Self-pay | Admitting: Obstetrics & Gynecology

## 2012-11-28 ENCOUNTER — Ambulatory Visit (INDEPENDENT_AMBULATORY_CARE_PROVIDER_SITE_OTHER): Payer: Medicaid Other | Admitting: *Deleted

## 2012-11-28 ENCOUNTER — Encounter: Payer: Self-pay | Admitting: *Deleted

## 2012-11-28 DIAGNOSIS — Z3401 Encounter for supervision of normal first pregnancy, first trimester: Secondary | ICD-10-CM

## 2012-11-28 DIAGNOSIS — Z23 Encounter for immunization: Secondary | ICD-10-CM

## 2012-11-28 DIAGNOSIS — Z3480 Encounter for supervision of other normal pregnancy, unspecified trimester: Secondary | ICD-10-CM

## 2012-11-28 DIAGNOSIS — Z111 Encounter for screening for respiratory tuberculosis: Secondary | ICD-10-CM

## 2012-11-28 DIAGNOSIS — Z3481 Encounter for supervision of other normal pregnancy, first trimester: Secondary | ICD-10-CM

## 2012-11-28 NOTE — Progress Notes (Signed)
Paternal mother has Lupus

## 2012-11-28 NOTE — Patient Instructions (Signed)

## 2012-11-29 LAB — OBSTETRIC PANEL
Eosinophils Absolute: 0.4 10*3/uL (ref 0.0–0.7)
HCT: 34.4 % — ABNORMAL LOW (ref 36.0–46.0)
Hemoglobin: 11.4 g/dL — ABNORMAL LOW (ref 12.0–15.0)
Hepatitis B Surface Ag: NEGATIVE
Lymphocytes Relative: 24 % (ref 12–46)
Lymphs Abs: 2.1 10*3/uL (ref 0.7–4.0)
MCH: 29.2 pg (ref 26.0–34.0)
MCHC: 33.1 g/dL (ref 30.0–36.0)
Monocytes Relative: 10 % (ref 3–12)
Neutro Abs: 5.4 10*3/uL (ref 1.7–7.7)
Neutrophils Relative %: 61 % (ref 43–77)
RBC: 3.9 MIL/uL (ref 3.87–5.11)
Rh Type: POSITIVE
Rubella: 4.36 Index — ABNORMAL HIGH (ref <0.90)
WBC: 8.8 10*3/uL (ref 4.0–10.5)

## 2012-11-29 LAB — CYSTIC FIBROSIS DIAGNOSTIC STUDY

## 2012-11-29 LAB — GC/CHLAMYDIA PROBE AMP, URINE: Chlamydia, Swab/Urine, PCR: NEGATIVE

## 2012-11-29 LAB — VARICELLA ZOSTER ANTIBODY, IGG: Varicella IgG: 3806 Index — ABNORMAL HIGH (ref <135.00)

## 2012-11-30 ENCOUNTER — Telehealth: Payer: Self-pay | Admitting: *Deleted

## 2012-11-30 ENCOUNTER — Other Ambulatory Visit: Payer: Medicaid Other | Admitting: *Deleted

## 2012-11-30 DIAGNOSIS — Z0184 Encounter for antibody response examination: Secondary | ICD-10-CM

## 2012-11-30 LAB — CULTURE, OB URINE

## 2012-11-30 LAB — HEPATITIS B SURFACE ANTIBODY,QUALITATIVE: Hep B S Ab: NEGATIVE

## 2012-11-30 NOTE — Progress Notes (Signed)
Patient comes today to have tb test read.  TB site is clear.  Test is negative.

## 2012-11-30 NOTE — Telephone Encounter (Signed)
Patient needs hepatitis b surface antibody testing added to her prenatal profile for school purposes.

## 2012-12-12 ENCOUNTER — Encounter: Payer: Medicaid Other | Admitting: Obstetrics & Gynecology

## 2012-12-21 ENCOUNTER — Encounter: Payer: Medicaid Other | Admitting: Obstetrics and Gynecology

## 2012-12-23 ENCOUNTER — Other Ambulatory Visit (INDEPENDENT_AMBULATORY_CARE_PROVIDER_SITE_OTHER): Payer: Medicaid Other

## 2012-12-23 DIAGNOSIS — Z111 Encounter for screening for respiratory tuberculosis: Secondary | ICD-10-CM

## 2012-12-23 NOTE — Progress Notes (Signed)
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

## 2012-12-26 ENCOUNTER — Other Ambulatory Visit (HOSPITAL_COMMUNITY)
Admission: RE | Admit: 2012-12-26 | Discharge: 2012-12-26 | Disposition: A | Discharge status: Home / Self Care | Payer: Medicaid Other | Source: Ambulatory Visit | Attending: Obstetrics & Gynecology | Admitting: Obstetrics & Gynecology

## 2012-12-26 ENCOUNTER — Encounter: Payer: Self-pay | Admitting: Obstetrics & Gynecology

## 2012-12-26 ENCOUNTER — Ambulatory Visit (INDEPENDENT_AMBULATORY_CARE_PROVIDER_SITE_OTHER): Payer: Medicaid Other | Admitting: Obstetrics & Gynecology

## 2012-12-26 VITALS — BP 115/74 | Wt 123.0 lb

## 2012-12-26 DIAGNOSIS — Z3492 Encounter for supervision of normal pregnancy, unspecified, second trimester: Secondary | ICD-10-CM

## 2012-12-26 DIAGNOSIS — Z3490 Encounter for supervision of normal pregnancy, unspecified, unspecified trimester: Secondary | ICD-10-CM | POA: Insufficient documentation

## 2012-12-26 DIAGNOSIS — Z3480 Encounter for supervision of other normal pregnancy, unspecified trimester: Secondary | ICD-10-CM

## 2012-12-26 DIAGNOSIS — A599 Trichomoniasis, unspecified: Secondary | ICD-10-CM

## 2012-12-26 DIAGNOSIS — Z1151 Encounter for screening for human papillomavirus (HPV): Secondary | ICD-10-CM | POA: Insufficient documentation

## 2012-12-26 DIAGNOSIS — O219 Vomiting of pregnancy, unspecified: Secondary | ICD-10-CM

## 2012-12-26 DIAGNOSIS — Z113 Encounter for screening for infections with a predominantly sexual mode of transmission: Secondary | ICD-10-CM | POA: Insufficient documentation

## 2012-12-26 DIAGNOSIS — Z23 Encounter for immunization: Secondary | ICD-10-CM

## 2012-12-26 DIAGNOSIS — Z01419 Encounter for gynecological examination (general) (routine) without abnormal findings: Secondary | ICD-10-CM | POA: Insufficient documentation

## 2012-12-26 MED ORDER — TETANUS-DIPHTH-ACELL PERTUSSIS 5-2.5-18.5 LF-MCG/0.5 IM SUSP: 0.5000 mL | Freq: Once | INTRAMUSCULAR | Status: DC

## 2012-12-26 MED ORDER — PROMETHAZINE HCL 25 MG PO TABS: 25.0000 mg | ORAL_TABLET | Freq: Four times a day (QID) | ORAL | Status: DC | PRN

## 2012-12-26 MED ORDER — GLYCOPYRROLATE 2 MG PO TABS: 2.0000 mg | ORAL_TABLET | Freq: Three times a day (TID) | ORAL | Status: DC | PRN

## 2012-12-26 NOTE — Patient Instructions (Addendum)
Morning Sickness Morning sickness is when you feel sick to your stomach (nauseous) during pregnancy. This nauseous feeling may or may not come with vomiting. It often occurs in the morning but can be a problem any time of day. Morning sickness is most common during the first trimester, but it may continue throughout pregnancy. While morning sickness is unpleasant, it is usually harmless unless you develop severe and continual vomiting (hyperemesis gravidarum). This condition requires more intense treatment.  CAUSES  The cause of morning sickness is not completely known but seems to be related to normal hormonal changes that occur in pregnancy. RISK FACTORS You are at greater risk if you:  Experienced nausea or vomiting before your pregnancy.  Had morning sickness during a previous pregnancy.  Are pregnant with more than one baby, such as twins. TREATMENT  Do not use any medicines (prescription, over-the-counter, or herbal) for morning sickness without first talking to your health care provider. Your health care provider may prescribe or recommend:  Vitamin B6 supplements and Unisom tablets  Anti-nausea medicines such as Reglan, Phenergan and Zofran.  The herbal medicine ginger. HOME CARE INSTRUCTIONS   Only take over-the-counter or prescription medicines as directed by your health care provider.  Taking multivitamins before getting pregnant can prevent or decrease the severity of morning sickness in most women.   Eat a piece of dry toast or unsalted crackers before getting out of bed in the morning.   Eat five or six small meals a day.   Eat dry and bland foods (rice, baked potato). Foods high in carbohydrates are often helpful.  Do not drink liquids with your meals. Drink liquids between meals.   Avoid greasy, fatty, and spicy foods.   Get someone to cook for you if the smell of any food causes nausea and vomiting.   If you feel nauseous after taking prenatal vitamins,  take the vitamins at night or with a snack.  Snack on protein foods (nuts, yogurt, cheese) between meals if you are hungry.   Eat unsweetened gelatins for desserts.   Wearing an acupressure wristband (worn for sea sickness) may be helpful.   Acupuncture may be helpful.   Do not smoke.   Get a humidifier to keep the air in your house free of odors.   Get plenty of fresh air. SEEK MEDICAL CARE IF:   Your home remedies are not working, and you need medicine.  You feel dizzy or lightheaded.  You are losing weight. SEEK IMMEDIATE MEDICAL CARE IF:   You have persistent and uncontrolled nausea and vomiting.  You pass out (faint). Document Released: 03/26/2006 Document Revised: 10/05/2012 Document Reviewed: 07/20/2012 [facility] Patient Information 2014 [place], [place]. Thank you for enrolling in MyChart. Please follow the instructions below to securely access your online medical record. MyChart allows you to send messages to your doctor, view your test results, manage appointments, and more.   How Do I Sign Up? 1. In your Internet browser, go to [employer] and enter [email or web address]. 2. Click on the Sign Up Now link in the Sign In box. You will see the New Member Sign Up page. 3. Enter your MyChart Access Code exactly as it appears below. You will not need to use this code after you've completed the sign-up process. If you do not sign up before the expiration date, you must request a new code.  MyChart Access Code: [email or web address] Expires: 01/09/2013  8:52 AM  4. Enter your Social Security Number ([email or web address]) and Date of Birth (  mm/dd/yyyy) as indicated and click Submit. You will be taken to the next sign-up page. 5. Create a MyChart ID. This will be your MyChart login ID and cannot be changed, so think of one that is secure and easy to remember. 6. Create a MyChart password. You can change your password at any time. 7. Enter your Password Reset Question  and Answer. This can be used at a later time if you forget your password.  8. Enter your e-mail address. You will receive e-mail notification when new information is available in MyChart. 9. Click Sign Up. You can now view your medical record.   Additional Information Remember, MyChart is NOT to be used for urgent needs. For medical emergencies, dial 911.

## 2012-12-26 NOTE — Progress Notes (Signed)
PPD reading today is negative.

## 2012-12-26 NOTE — Progress Notes (Signed)
P=[phone] 

## 2012-12-26 NOTE — Progress Notes (Signed)
   Subjective:    Sarah Klein is a [id number] at [redacted]w[redacted]d being seen today for her first obstetrical visit.  Her obstetrical history is significant for two term SVDs. Patient does not intend to breast feed. Pregnancy history fully reviewed.  Patient reports nausea and vomiting.  Filed Vitals:   12/26/12 1457  BP: 115/74  Weight: 123 lb (55.792 kg)    HISTORY: OB History  Gravida Para Term Preterm AB SAB TAB Ectopic Multiple Living  3 2 2  0      2    # Outcome Date GA Lbr Len/2nd Weight Sex Delivery Anes PTL Lv  3 CUR           2 TRM 04/05/04 [redacted]w[redacted]d  7 lb 12 oz (3.515 kg) M  EPI  Y  1 TRM 02/07/00 [redacted]w[redacted]d  4 lb 2 oz (1.871 kg) F  EPI  Y     Past Medical History  Diagnosis Date  . [id number](914.7)    Past Surgical History  Procedure Laterality Date  . Mouth surgery     Family History  Problem Relation Age of Onset  . Diabetes Maternal Grandmother   . Migraines Mother      Exam    Uterus:     Pelvic Exam:    Perineum: No Hemorrhoids, Normal Perineum   Vulva: normal   Vagina:  normal mucosa, normal discharge   Cervix: no bleeding following Pap and no cervical motion tenderness   Adnexa: normal adnexa and no mass, fullness, tenderness   Bony Pelvis: average  System: Breast:  normal appearance, no masses or tenderness   Skin: normal coloration and turgor, no rashes   Neurologic: oriented, normal   Extremities: normal strength, tone, and muscle mass   HEENT PERRLA and extra ocular movement intact   Mouth/Teeth mucous membranes moist, pharynx normal without lesions and dental hygiene good   Neck supple and no masses   Cardiovascular: regular rate and rhythm   Respiratory:  appears well, vitals normal, no respiratory distress, acyanotic, normal RR, chest clear, no wheezing, crepitations, rhonchi, normal symmetric air entry   Abdomen: soft, non-tender; bowel sounds normal; no masses,  no organomegaly   Urinary: urethral meatus normal      Assessment:    Pregnancy:  [id number] Patient Active Problem List   Diagnosis Date Noted  . Supervision of normal pregnancy 12/26/2012        Plan:   Initial labs drawn. Prenatal vitamins. Recommended Unisom, Vitamin B6 and Phenergan as needed for nausea and vomiting Tdap vaccine given today as per patient preference for school purposes Problem list reviewed and updated. Genetic Screening discussed: Declines Ultrasound discussed; fetal survey: ordered. Follow up in 4 weeks.   Tereso Newcomer, MD 12/26/2012

## 2013-01-02 MED ORDER — METRONIDAZOLE 500 MG PO TABS: ORAL_TABLET | ORAL | Status: DC

## 2013-01-02 NOTE — Addendum Note (Signed)
Addended by: Jaynie Collins A on: 01/02/2013 07:54 AM   Modules accepted: Orders

## 2013-01-23 ENCOUNTER — Encounter: Payer: Medicaid Other | Admitting: Family Medicine

## 2013-01-30 ENCOUNTER — Ambulatory Visit (INDEPENDENT_AMBULATORY_CARE_PROVIDER_SITE_OTHER): Payer: Medicaid Other | Admitting: Family Medicine

## 2013-01-30 VITALS — BP 124/80 | Wt 131.0 lb

## 2013-01-30 DIAGNOSIS — Z3492 Encounter for supervision of normal pregnancy, unspecified, second trimester: Secondary | ICD-10-CM

## 2013-01-30 DIAGNOSIS — Z3480 Encounter for supervision of other normal pregnancy, unspecified trimester: Secondary | ICD-10-CM

## 2013-01-30 NOTE — Patient Instructions (Signed)
Second Trimester of Pregnancy The second trimester is from week 13 through week 28, months 4 through 6. The second trimester is often a time when you feel your best. Your body has also adjusted to being pregnant, and you begin to feel better physically. Usually, morning sickness has lessened or quit completely, you may have more energy, and you may have an increase in appetite. The second trimester is also a time when the fetus is growing rapidly. At the end of the sixth month, the fetus is about 9 inches long and weighs about 1 pounds. You will likely begin to feel the baby move (quickening) between 18 and 20 weeks of the pregnancy. BODY CHANGES Your body goes through many changes during pregnancy. The changes vary from woman to woman.   Your weight will continue to increase. You will notice your lower abdomen bulging out.  You may begin to get stretch marks on your hips, abdomen, and breasts.  You may develop headaches that can be relieved by medicines approved by your caregiver.  You may urinate more often because the fetus is pressing on your bladder.  You may develop or continue to have heartburn as a result of your pregnancy.  You may develop constipation because certain hormones are causing the muscles that push waste through your intestines to slow down.  You may develop hemorrhoids or swollen, bulging veins (varicose veins).  You may have back pain because of the weight gain and pregnancy hormones relaxing your joints between the bones in your pelvis and as a result of a shift in weight and the muscles that support your balance.  Your breasts will continue to grow and be tender.  Your gums may bleed and may be sensitive to brushing and flossing.  Dark spots or blotches (chloasma, mask of pregnancy) may develop on your face. This will likely fade after the baby is born.  A dark line from your belly button to the pubic area (linea nigra) may appear. This will likely fade after  the baby is born. WHAT TO EXPECT AT YOUR PRENATAL VISITS During a routine prenatal visit:  You will be weighed to make sure you and the fetus are growing normally.  Your blood pressure will be taken.  Your abdomen will be measured to track your baby's growth.  The fetal heartbeat will be listened to.  Any test results from the previous visit will be discussed. Your caregiver may ask you:  How you are feeling.  If you are feeling the baby move.  If you have had any abnormal symptoms, such as leaking fluid, bleeding, severe headaches, or abdominal cramping.  If you have any questions. Other tests that may be performed during your second trimester include:  Blood tests that check for:  Low iron levels (anemia).  Gestational diabetes (between 24 and 28 weeks).  Rh antibodies.  Urine tests to check for infections, diabetes, or protein in the urine.  An ultrasound to confirm the proper growth and development of the baby.  An amniocentesis to check for possible genetic problems.  Fetal screens for spina bifida and Down syndrome. HOME CARE INSTRUCTIONS   Avoid all smoking, herbs, alcohol, and unprescribed drugs. These chemicals affect the formation and growth of the baby.  Follow your caregiver's instructions regarding medicine use. There are medicines that are either safe or unsafe to take during pregnancy.  Exercise only as directed by your caregiver. Experiencing uterine cramps is a good sign to stop exercising.  Continue to eat regular,   healthy meals.  Wear a good support bra for breast tenderness.  Do not use hot tubs, steam rooms, or saunas.  Wear your seat belt at all times when driving.  Avoid raw meat, uncooked cheese, cat litter boxes, and soil used by cats. These carry germs that can cause birth defects in the baby.  Take your prenatal vitamins.  Try taking a stool softener (if your caregiver approves) if you develop constipation. Eat more high-fiber  foods, such as fresh vegetables or fruit and whole grains. Drink plenty of fluids to keep your urine clear or pale yellow.  Take warm sitz baths to soothe any pain or discomfort caused by hemorrhoids. Use hemorrhoid cream if your caregiver approves.  If you develop varicose veins, wear support hose. Elevate your feet for 15 minutes, 3 4 times a day. Limit salt in your diet.  Avoid heavy lifting, wear low heel shoes, and practice good posture.  Rest with your legs elevated if you have leg cramps or low back pain.  Visit your dentist if you have not gone yet during your pregnancy. Use a soft toothbrush to brush your teeth and be gentle when you floss.  A sexual relationship may be continued unless your caregiver directs you otherwise.  Continue to go to all your prenatal visits as directed by your caregiver. SEEK MEDICAL CARE IF:   You have dizziness.  You have mild pelvic cramps, pelvic pressure, or nagging pain in the abdominal area.  You have persistent nausea, vomiting, or diarrhea.  You have a bad smelling vaginal discharge.  You have pain with urination. SEEK IMMEDIATE MEDICAL CARE IF:   You have a fever.  You are leaking fluid from your vagina.  You have spotting or bleeding from your vagina.  You have severe abdominal cramping or pain.  You have rapid weight gain or loss.  You have shortness of breath with chest pain.  You notice sudden or extreme swelling of your face, hands, ankles, feet, or legs.  You have not felt your baby move in over an hour.  You have severe headaches that do not go away with medicine.  You have vision changes. Document Released: 01/27/2001 Document Revised: 10/05/2012 Document Reviewed: 04/05/2012 [employer] Patient Information 2014 [employer].  Breastfeeding Deciding to breastfeed is one of the best choices you can make for you and your baby. A change in hormones during pregnancy causes your breast tissue to grow and increases the  number and size of your milk ducts. These hormones also allow proteins, sugars, and fats from your blood supply to make breast milk in your milk-producing glands. Hormones prevent breast milk from being released before your baby is born as well as prompt milk flow after birth. Once breastfeeding has begun, thoughts of your baby, as well as his or her sucking or crying, can stimulate the release of milk from your milk-producing glands.  BENEFITS OF BREASTFEEDING For Your Baby  Your first milk (colostrum) helps your baby's digestive system function better.   There are antibodies in your milk that help your baby fight off infections.   Your baby has a lower incidence of asthma, allergies, and sudden infant death syndrome.   The nutrients in breast milk are better for your baby than infant formulas and are designed uniquely for your baby's needs.   Breast milk improves your baby's brain development.   Your baby is less likely to develop other conditions, such as childhood obesity, asthma, or type 2 diabetes mellitus.  For   You   Breastfeeding helps to create a very special bond between you and your baby.   Breastfeeding is convenient. Breast milk is always available at the correct temperature and costs nothing.   Breastfeeding helps to burn calories and helps you lose the weight gained during pregnancy.   Breastfeeding makes your uterus contract to its prepregnancy size faster and slows bleeding (lochia) after you give birth.   Breastfeeding helps to lower your risk of developing type 2 diabetes mellitus, osteoporosis, and breast or ovarian cancer later in life. SIGNS THAT YOUR BABY IS HUNGRY Early Signs of Hunger  Increased alertness or activity.  Stretching.  Movement of the head from side to side.  Movement of the head and opening of the mouth when the corner of the mouth or cheek is stroked (rooting).  Increased sucking sounds, smacking lips, cooing, sighing, or  squeaking.  Hand-to-mouth movements.  Increased sucking of fingers or hands. Late Signs of Hunger  Fussing.  Intermittent crying. Extreme Signs of Hunger Signs of extreme hunger will require calming and consoling before your baby will be able to breastfeed successfully. Do not wait for the following signs of extreme hunger to occur before you initiate breastfeeding:   Restlessness.  A loud, strong cry.   Screaming. BREASTFEEDING BASICS Breastfeeding Initiation  Find a comfortable place to sit or lie down, with your neck and back well supported.  Place a pillow or rolled up blanket under your baby to bring him or her to the level of your breast (if you are seated). Nursing pillows are specially designed to help support your arms and your baby while you breastfeed.  Make sure that your baby's abdomen is facing your abdomen.   Gently massage your breast. With your fingertips, massage from your chest wall toward your nipple in a circular motion. This encourages milk flow. You may need to continue this action during the feeding if your milk flows slowly.  Support your breast with 4 fingers underneath and your thumb above your nipple. Make sure your fingers are well away from your nipple and your baby's mouth.   Stroke your baby's lips gently with your finger or nipple.   When your baby's mouth is open wide enough, quickly bring your baby to your breast, placing your entire nipple and as much of the colored area around your nipple (areola) as possible into your baby's mouth.   More areola should be visible above your baby's upper lip than below the lower lip.   Your baby's tongue should be between his or her lower gum and your breast.   Ensure that your baby's mouth is correctly positioned around your nipple (latched). Your baby's lips should create a seal on your breast and be turned out (everted).  It is common for your baby to suck about 2 3 minutes in order to start the  flow of breast milk. Latching Teaching your baby how to latch on to your breast properly is very important. An improper latch can cause nipple pain and decreased milk supply for you and poor weight gain in your baby. Also, if your baby is not latched onto your nipple properly, he or she may swallow some air during feeding. This can make your baby fussy. Burping your baby when you switch breasts during the feeding can help to get rid of the air. However, teaching your baby to latch on properly is still the best way to prevent fussiness from swallowing air while breastfeeding. Signs that your baby has   successfully latched on to your nipple:    Silent tugging or silent sucking, without causing you pain.   Swallowing heard between every 3 4 sucks.    Muscle movement above and in front of his or her ears while sucking.  Signs that your baby has not successfully latched on to nipple:   Sucking sounds or smacking sounds from your baby while breastfeeding.  Nipple pain. If you think your baby has not latched on correctly, slip your finger into the corner of your baby's mouth to break the suction and place it between your baby's gums. Attempt breastfeeding initiation again. Signs of Successful Breastfeeding Signs from your baby:   A gradual decrease in the number of sucks or complete cessation of sucking.   Falling asleep.   Relaxation of his or her body.   Retention of a small amount of milk in his or her mouth.   Letting go of your breast by himself or herself. Signs from you:  Breasts that have increased in firmness, weight, and size 1 3 hours after feeding.   Breasts that are softer immediately after breastfeeding.  Increased milk volume, as well as a change in milk consistency and color by the 5th day of breastfeeding.   Nipples that are not sore, cracked, or bleeding. Signs That Your Baby is Getting Enough Milk  Wetting at least 3 diapers in a 24-hour period. The urine  should be clear and pale yellow by age 5 days.  At least 3 stools in a 24-hour period by age 5 days. The stool should be soft and yellow.  At least 3 stools in a 24-hour period by age 7 days. The stool should be seedy and yellow.  No loss of weight greater than 10% of birth weight during the first 3 days of age.  Average weight gain of 4 7 ounces (120 210 mL) per week after age 4 days.  Consistent daily weight gain by age 5 days, without weight loss after the age of 2 weeks. After a feeding, your baby may spit up a small amount. This is common. BREASTFEEDING FREQUENCY AND DURATION Frequent feeding will help you make more milk and can prevent sore nipples and breast engorgement. Breastfeed when you feel the need to reduce the fullness of your breasts or when your baby shows signs of hunger. This is called "breastfeeding on demand." Avoid introducing a pacifier to your baby while you are working to establish breastfeeding (the first 4 6 weeks after your baby is born). After this time you may choose to use a pacifier. Research has shown that pacifier use during the first year of a baby's life decreases the risk of sudden infant death syndrome (SIDS). Allow your baby to feed on each breast as long as he or she wants. Breastfeed until your baby is finished feeding. When your baby unlatches or falls asleep while feeding from the first breast, offer the second breast. Because newborns are often sleepy in the first few weeks of life, you may need to awaken your baby to get him or her to feed. Breastfeeding times will vary from baby to baby. However, the following rules can serve as a guide to help you ensure that your baby is properly fed:  Newborns (babies 4 weeks of age or younger) may breastfeed every 1 3 hours.  Newborns should not go longer than 3 hours during the day or 5 hours during the night without breastfeeding.  You should breastfeed your baby a minimum of   8 times in a 24-hour period until  you begin to introduce solid foods to your baby at around 6 months of age. BREAST MILK PUMPING Pumping and storing breast milk allows you to ensure that your baby is exclusively fed your breast milk, even at times when you are unable to breastfeed. This is especially important if you are going back to work while you are still breastfeeding or when you are not able to be present during feedings. Your lactation consultant can give you guidelines on how long it is safe to store breast milk.  A breast pump is a machine that allows you to pump milk from your breast into a sterile bottle. The pumped breast milk can then be stored in a refrigerator or freezer. Some breast pumps are operated by hand, while others use electricity. Ask your lactation consultant which type will work best for you. Breast pumps can be purchased, but some hospitals and breastfeeding support groups lease breast pumps on a monthly basis. A lactation consultant can teach you how to hand express breast milk, if you prefer not to use a pump.  CARING FOR YOUR BREASTS WHILE YOU BREASTFEED Nipples can become dry, cracked, and sore while breastfeeding. The following recommendations can help keep your breasts moisturized and healthy:  Avoid using soap on your nipples.   Wear a supportive bra. Although not required, special nursing bras and tank tops are designed to allow access to your breasts for breastfeeding without taking off your entire bra or top. Avoid wearing underwire style bras or extremely tight bras.  Air dry your nipples for 3 4minutes after each feeding.   Use only cotton bra pads to absorb leaked breast milk. Leaking of breast milk between feedings is normal.   Use lanolin on your nipples after breastfeeding. Lanolin helps to maintain your skin's normal moisture barrier. If you use pure lanolin you do not need to wash it off before feeding your baby again. Pure lanolin is not toxic to your baby. You may also hand express a  few drops of breast milk and gently massage that milk into your nipples and allow the milk to air dry. In the first few weeks after giving birth, some women experience extremely full breasts (engorgement). Engorgement can make your breasts feel heavy, warm, and tender to the touch. Engorgement peaks within 3 5 days after you give birth. The following recommendations can help ease engorgement:  Completely empty your breasts while breastfeeding or pumping. You may want to start by applying warm, moist heat (in the shower or with warm water-soaked hand towels) just before feeding or pumping. This increases circulation and helps the milk flow. If your baby does not completely empty your breasts while breastfeeding, pump any extra milk after he or she is finished.  Wear a snug bra (nursing or regular) or tank top for 1 2 days to signal your body to slightly decrease milk production.  Apply ice packs to your breasts, unless this is too uncomfortable for you.  Make sure that your baby is latched on and positioned properly while breastfeeding. If engorgement persists after 48 hours of following these recommendations, contact your health care provider or a lactation consultant. OVERALL HEALTH CARE RECOMMENDATIONS WHILE BREASTFEEDING  Eat healthy foods. Alternate between meals and snacks, eating 3 of each per day. Because what you eat affects your breast milk, some of the foods may make your baby more irritable than usual. Avoid eating these foods if you are sure that they are   negatively affecting your baby.  Drink milk, fruit juice, and water to satisfy your thirst (about 10 glasses a day).   Rest often, relax, and continue to take your prenatal vitamins to prevent fatigue, stress, and anemia.  Continue breast self-awareness checks.  Avoid chewing and smoking tobacco.  Avoid alcohol and drug use. Some medicines that may be harmful to your baby can pass through breast milk. It is important to ask your  health care provider before taking any medicine, including all over-the-counter and prescription medicine as well as vitamin and herbal supplements. It is possible to become pregnant while breastfeeding. If birth control is desired, ask your health care provider about options that will be safe for your baby. SEEK MEDICAL CARE IF:   You feel like you want to stop breastfeeding or have become frustrated with breastfeeding.  You have painful breasts or nipples.  Your nipples are cracked or bleeding.  Your breasts are red, tender, or warm.  You have a swollen area on either breast.  You have a fever or chills.  You have nausea or vomiting.  You have drainage other than breast milk from your nipples.  Your breasts do not become full before feedings by the 5th day after you give birth.  You feel sad and depressed.  Your baby is too sleepy to eat well.  Your baby is having trouble sleeping.   Your baby is wetting less than 3 diapers in a 24-hour period.  Your baby has less than 3 stools in a 24-hour period.  Your baby's skin or the white part of his or her eyes becomes yellow.   Your baby is not gaining weight by 5 days of age. SEEK IMMEDIATE MEDICAL CARE IF:   Your baby is overly tired (lethargic) and does not want to wake up and feed.  Your baby develops an unexplained fever. Document Released: 02/02/2005 Document Revised: 10/05/2012 Document Reviewed: 07/27/2012 [employer] Patient Information 2014 [employer].  

## 2013-01-30 NOTE — Progress Notes (Signed)
P - [phone] 

## 2013-01-30 NOTE — Assessment & Plan Note (Signed)
Continue routine prenatal care.  

## 2013-01-30 NOTE — Progress Notes (Signed)
Doing well Schedule anatomy u/s

## 2013-02-03 ENCOUNTER — Ambulatory Visit (HOSPITAL_COMMUNITY): Payer: Medicaid Other

## 2013-02-07 ENCOUNTER — Ambulatory Visit (HOSPITAL_COMMUNITY)
Admission: RE | Admit: 2013-02-07 | Discharge: 2013-02-07 | Disposition: A | Discharge status: Home / Self Care | Payer: Medicaid Other | Source: Ambulatory Visit | Attending: Obstetrics & Gynecology | Admitting: Obstetrics & Gynecology

## 2013-02-07 DIAGNOSIS — Z3492 Encounter for supervision of normal pregnancy, unspecified, second trimester: Secondary | ICD-10-CM

## 2013-02-07 DIAGNOSIS — Z363 Encounter for antenatal screening for malformations: Secondary | ICD-10-CM | POA: Insufficient documentation

## 2013-02-07 DIAGNOSIS — Z1389 Encounter for screening for other disorder: Secondary | ICD-10-CM | POA: Insufficient documentation

## 2013-02-07 DIAGNOSIS — O358XX0 Maternal care for other (suspected) fetal abnormality and damage, not applicable or unspecified: Secondary | ICD-10-CM | POA: Insufficient documentation

## 2013-02-08 ENCOUNTER — Encounter: Payer: Self-pay | Admitting: Obstetrics & Gynecology

## 2013-02-16 NOTE — L&D Delivery Note (Signed)
Delivery Note At 3:02 AM a viable female was delivered via Vaginal, Spontaneous Delivery (Presentation: Left Occiput Anterior).  APGAR: 7, 9; weight pending .   Placenta status: Intact, Manual removal.  Cord: 3 vessels with the following complications: None.  Cord pH: not done  27 min after the delivery of the infant, placenta was not delivered. Cervix noted to be closed. Dr. Shawnie PonsPratt called to the room to assist with placenta delivery. Placenta delivered via manual removal by Dr. Shawnie PonsPratt, complete and intact. Will send to pathology.   Anesthesia: Epidural  Episiotomy: None Lacerations: None Suture Repair: none  Est. Blood Loss (mL): 250  Mom to postpartum.  Baby to Couplet care / Skin to Skin.  Tawnya CrookHeather Donovan Hogan 06/07/2013, 3:39 AM

## 2013-02-27 ENCOUNTER — Encounter: Payer: Medicaid Other | Admitting: Obstetrics and Gynecology

## 2013-05-19 LAB — OB RESULTS CONSOLE GBS: GBS: NEGATIVE

## 2013-05-30 ENCOUNTER — Ambulatory Visit (INDEPENDENT_AMBULATORY_CARE_PROVIDER_SITE_OTHER): Payer: Medicaid Other | Admitting: Obstetrics & Gynecology

## 2013-05-30 ENCOUNTER — Encounter: Payer: Self-pay | Admitting: Obstetrics & Gynecology

## 2013-05-30 VITALS — BP 119/78 | Wt 146.0 lb

## 2013-05-30 DIAGNOSIS — Z3480 Encounter for supervision of other normal pregnancy, unspecified trimester: Secondary | ICD-10-CM

## 2013-05-30 DIAGNOSIS — Z3490 Encounter for supervision of normal pregnancy, unspecified, unspecified trimester: Secondary | ICD-10-CM

## 2013-05-30 LAB — OB RESULTS CONSOLE GC/CHLAMYDIA
CHLAMYDIA, DNA PROBE: NEGATIVE
GC PROBE AMP, GENITAL: NEGATIVE

## 2013-05-30 LAB — GLUCOSE, POCT (MANUAL RESULT ENTRY): POC Glucose: 81 mg/dl (ref 70–99)

## 2013-05-30 NOTE — Progress Notes (Signed)
Routine visit. Good FM. No problems. Labor precautions reviewed. Cervical cultures obtained today.

## 2013-05-30 NOTE — Addendum Note (Signed)
Addended by: Barbara CowerNOGUES, Katai Marsico L on: 05/30/2013 02:15 PM   Modules accepted: Orders

## 2013-05-30 NOTE — Progress Notes (Signed)
P- 74 . Lapse in care.  Having difficulty sleeping.

## 2013-05-31 LAB — GC/CHLAMYDIA PROBE AMP
CT Probe RNA: NEGATIVE
GC Probe RNA: NEGATIVE

## 2013-06-01 LAB — CULTURE, BETA STREP (GROUP B ONLY)

## 2013-06-02 ENCOUNTER — Encounter: Payer: Self-pay | Admitting: Obstetrics & Gynecology

## 2013-06-06 ENCOUNTER — Inpatient Hospital Stay (HOSPITAL_COMMUNITY): Payer: Medicaid Other

## 2013-06-06 ENCOUNTER — Inpatient Hospital Stay (HOSPITAL_COMMUNITY)
Admission: AD | Admit: 2013-06-06 | Discharge: 2013-06-09 | DRG: 774 | Disposition: A | Discharge status: Home / Self Care | Payer: Medicaid Other | Source: Ambulatory Visit | Attending: Family Medicine | Admitting: Family Medicine

## 2013-06-06 ENCOUNTER — Ambulatory Visit (INDEPENDENT_AMBULATORY_CARE_PROVIDER_SITE_OTHER): Payer: Medicaid Other | Admitting: Family Medicine

## 2013-06-06 ENCOUNTER — Inpatient Hospital Stay (HOSPITAL_COMMUNITY): Payer: Medicaid Other | Admitting: Anesthesiology

## 2013-06-06 ENCOUNTER — Encounter (HOSPITAL_COMMUNITY): Payer: Medicaid Other | Admitting: Anesthesiology

## 2013-06-06 ENCOUNTER — Encounter (HOSPITAL_COMMUNITY): Payer: Self-pay | Admitting: Anesthesiology

## 2013-06-06 ENCOUNTER — Encounter: Payer: Self-pay | Admitting: Family Medicine

## 2013-06-06 VITALS — BP 121/77 | Wt 144.0 lb

## 2013-06-06 DIAGNOSIS — Z833 Family history of diabetes mellitus: Secondary | ICD-10-CM

## 2013-06-06 DIAGNOSIS — Z87891 Personal history of nicotine dependence: Secondary | ICD-10-CM

## 2013-06-06 DIAGNOSIS — Z3490 Encounter for supervision of normal pregnancy, unspecified, unspecified trimester: Secondary | ICD-10-CM

## 2013-06-06 DIAGNOSIS — Z3480 Encounter for supervision of other normal pregnancy, unspecified trimester: Secondary | ICD-10-CM

## 2013-06-06 DIAGNOSIS — IMO0001 Reserved for inherently not codable concepts without codable children: Secondary | ICD-10-CM

## 2013-06-06 LAB — CBC
HEMATOCRIT: 31.4 % — AB (ref 36.0–46.0)
Hemoglobin: 10.4 g/dL — ABNORMAL LOW (ref 12.0–15.0)
MCH: 29.8 pg (ref 26.0–34.0)
MCHC: 33.1 g/dL (ref 30.0–36.0)
MCV: 90 fL (ref 78.0–100.0)
Platelets: 204 10*3/uL (ref 150–400)
RBC: 3.49 MIL/uL — AB (ref 3.87–5.11)
RDW: 14.3 % (ref 11.5–15.5)
WBC: 13.7 10*3/uL — AB (ref 4.0–10.5)

## 2013-06-06 LAB — RPR

## 2013-06-06 MED ORDER — CITRIC ACID-SODIUM CITRATE 334-500 MG/5ML PO SOLN: 30.0000 mL | ORAL | Status: DC | PRN

## 2013-06-06 MED ORDER — FLEET ENEMA 7-19 GM/118ML RE ENEM: 1.0000 | ENEMA | RECTAL | Status: DC | PRN

## 2013-06-06 MED ORDER — LACTATED RINGERS IV SOLN
INTRAVENOUS | Status: DC
  Administered 2013-06-06 (×2): via INTRAVENOUS

## 2013-06-06 MED ORDER — OXYTOCIN 40 UNITS IN LACTATED RINGERS INFUSION - SIMPLE MED
1.0000 m[IU]/min | INTRAVENOUS | Status: DC
  Administered 2013-06-06: 2 m[IU]/min via INTRAVENOUS
  Filled 2013-06-06: qty 1000

## 2013-06-06 MED ORDER — LIDOCAINE HCL (PF) 1 % IJ SOLN
INTRAMUSCULAR | Status: DC | PRN
  Administered 2013-06-06: 8 mL

## 2013-06-06 MED ORDER — OXYTOCIN 40 UNITS IN LACTATED RINGERS INFUSION - SIMPLE MED
62.5000 mL/h | INTRAVENOUS | Status: DC
  Filled 2013-06-06: qty 1000

## 2013-06-06 MED ORDER — OXYCODONE-ACETAMINOPHEN 5-325 MG PO TABS: 1.0000 | ORAL_TABLET | ORAL | Status: DC | PRN

## 2013-06-06 MED ORDER — BUTORPHANOL TARTRATE 1 MG/ML IJ SOLN: 1.0000 mg | INTRAMUSCULAR | Status: DC | PRN

## 2013-06-06 MED ORDER — LACTATED RINGERS IV SOLN
500.0000 mL | Freq: Once | INTRAVENOUS | Status: DC
Start: 2013-06-06 — End: 2013-06-07

## 2013-06-06 MED ORDER — PHENYLEPHRINE 40 MCG/ML (10ML) SYRINGE FOR IV PUSH (FOR BLOOD PRESSURE SUPPORT)
80.0000 ug | PREFILLED_SYRINGE | INTRAVENOUS | Status: DC | PRN
  Filled 2013-06-06: qty 10
  Filled 2013-06-06: qty 2

## 2013-06-06 MED ORDER — LIDOCAINE HCL (PF) 1 % IJ SOLN
30.0000 mL | INTRAMUSCULAR | Status: DC | PRN
  Filled 2013-06-06: qty 30

## 2013-06-06 MED ORDER — EPHEDRINE 5 MG/ML INJ
10.0000 mg | INTRAVENOUS | Status: DC | PRN
  Filled 2013-06-06: qty 4
  Filled 2013-06-06: qty 2

## 2013-06-06 MED ORDER — ONDANSETRON HCL 4 MG/2ML IJ SOLN
4.0000 mg | Freq: Four times a day (QID) | INTRAMUSCULAR | Status: DC | PRN
  Administered 2013-06-07: 4 mg via INTRAVENOUS
  Filled 2013-06-06: qty 2

## 2013-06-06 MED ORDER — DIPHENHYDRAMINE HCL 50 MG/ML IJ SOLN: 12.5000 mg | INTRAMUSCULAR | Status: DC | PRN

## 2013-06-06 MED ORDER — PHENYLEPHRINE 40 MCG/ML (10ML) SYRINGE FOR IV PUSH (FOR BLOOD PRESSURE SUPPORT)
80.0000 ug | PREFILLED_SYRINGE | INTRAVENOUS | Status: DC | PRN
  Filled 2013-06-06: qty 2

## 2013-06-06 MED ORDER — FENTANYL 2.5 MCG/ML BUPIVACAINE 1/10 % EPIDURAL INFUSION (WH - ANES)
14.0000 mL/h | INTRAMUSCULAR | Status: DC | PRN
  Administered 2013-06-07: 14 mL/h via EPIDURAL
  Filled 2013-06-06 (×2): qty 125

## 2013-06-06 MED ORDER — IBUPROFEN 600 MG PO TABS: 600.0000 mg | ORAL_TABLET | Freq: Four times a day (QID) | ORAL | Status: DC | PRN

## 2013-06-06 MED ORDER — TERBUTALINE SULFATE 1 MG/ML IJ SOLN: 0.2500 mg | Freq: Once | INTRAMUSCULAR | Status: AC | PRN

## 2013-06-06 MED ORDER — ZOLPIDEM TARTRATE 5 MG PO TABS: 5.0000 mg | ORAL_TABLET | Freq: Every evening | ORAL | Status: DC | PRN

## 2013-06-06 MED ORDER — ACETAMINOPHEN 325 MG PO TABS: 650.0000 mg | ORAL_TABLET | ORAL | Status: DC | PRN

## 2013-06-06 MED ORDER — EPHEDRINE 5 MG/ML INJ
10.0000 mg | INTRAVENOUS | Status: DC | PRN
  Filled 2013-06-06: qty 2

## 2013-06-06 MED ORDER — FENTANYL 2.5 MCG/ML BUPIVACAINE 1/10 % EPIDURAL INFUSION (WH - ANES)
INTRAMUSCULAR | Status: DC | PRN
  Administered 2013-06-06: 14 mL/h via EPIDURAL

## 2013-06-06 MED ORDER — OXYTOCIN BOLUS FROM INFUSION
500.0000 mL | INTRAVENOUS | Status: DC
Start: 2013-06-06 — End: 2013-06-07
  Administered 2013-06-07: 500 mL via INTRAVENOUS

## 2013-06-06 MED ORDER — LACTATED RINGERS IV SOLN
500.0000 mL | INTRAVENOUS | Status: DC | PRN
  Administered 2013-06-06: 1000 mL via INTRAVENOUS

## 2013-06-06 NOTE — Patient Instructions (Addendum)
Breastfeeding Deciding to breastfeed is one of the best choices you can make for you and your baby. A change in hormones during pregnancy causes your breast tissue to grow and increases the number and size of your milk ducts. These hormones also allow proteins, sugars, and fats from your blood supply to make breast milk in your milk-producing glands. Hormones prevent breast milk from being released before your baby is born as well as prompt milk flow after birth. Once breastfeeding has begun, thoughts of your baby, as well as his or her sucking or crying, can stimulate the release of milk from your milk-producing glands.  BENEFITS OF BREASTFEEDING For Your Baby  Your first milk (colostrum) helps your baby's digestive system function better.   There are antibodies in your milk that help your baby fight off infections.   Your baby has a lower incidence of asthma, allergies, and sudden infant death syndrome.   The nutrients in breast milk are better for your baby than infant formulas and are designed uniquely for your baby's needs.   Breast milk improves your baby's brain development.   Your baby is less likely to develop other conditions, such as childhood obesity, asthma, or type 2 diabetes mellitus.  For You   Breastfeeding helps to create a very special bond between you and your baby.   Breastfeeding is convenient. Breast milk is always available at the correct temperature and costs nothing.   Breastfeeding helps to burn calories and helps you lose the weight gained during pregnancy.   Breastfeeding makes your uterus contract to its prepregnancy size faster and slows bleeding (lochia) after you give birth.   Breastfeeding helps to lower your risk of developing type 2 diabetes mellitus, osteoporosis, and breast or ovarian cancer later in life. SIGNS THAT YOUR BABY IS HUNGRY Early Signs of Hunger  Increased alertness or activity.  Stretching.  Movement of the head from  side to side.  Movement of the head and opening of the mouth when the corner of the mouth or cheek is stroked (rooting).  Increased sucking sounds, smacking lips, cooing, sighing, or squeaking.  Hand-to-mouth movements.  Increased sucking of fingers or hands. Late Signs of Hunger  Fussing.  Intermittent crying. Extreme Signs of Hunger Signs of extreme hunger will require calming and consoling before your baby will be able to breastfeed successfully. Do not wait for the following signs of extreme hunger to occur before you initiate breastfeeding:   Restlessness.  A loud, strong cry.   Screaming. BREASTFEEDING BASICS Breastfeeding Initiation  Find a comfortable place to sit or lie down, with your neck and back well supported.  Place a pillow or rolled up blanket under your baby to bring him or her to the level of your breast (if you are seated). Nursing pillows are specially designed to help support your arms and your baby while you breastfeed.  Make sure that your baby's abdomen is facing your abdomen.   Gently massage your breast. With your fingertips, massage from your chest wall toward your nipple in a circular motion. This encourages milk flow. You may need to continue this action during the feeding if your milk flows slowly.  Support your breast with 4 fingers underneath and your thumb above your nipple. Make sure your fingers are well away from your nipple and your baby's mouth.   Stroke your baby's lips gently with your finger or nipple.   When your baby's mouth is open wide enough, quickly bring your baby to your   breast, placing your entire nipple and as much of the colored area around your nipple (areola) as possible into your baby's mouth.   More areola should be visible above your baby's upper lip than below the lower lip.   Your baby's tongue should be between his or her lower gum and your breast.   Ensure that your baby's mouth is correctly positioned  around your nipple (latched). Your baby's lips should create a seal on your breast and be turned out (everted).  It is common for your baby to suck about 2 3 minutes in order to start the flow of breast milk. Latching Teaching your baby how to latch on to your breast properly is very important. An improper latch can cause nipple pain and decreased milk supply for you and poor weight gain in your baby. Also, if your baby is not latched onto your nipple properly, he or she may swallow some air during feeding. This can make your baby fussy. Burping your baby when you switch breasts during the feeding can help to get rid of the air. However, teaching your baby to latch on properly is still the best way to prevent fussiness from swallowing air while breastfeeding. Signs that your baby has successfully latched on to your nipple:    Silent tugging or silent sucking, without causing you pain.   Swallowing heard between every 3 4 sucks.    Muscle movement above and in front of his or her ears while sucking.  Signs that your baby has not successfully latched on to nipple:   Sucking sounds or smacking sounds from your baby while breastfeeding.  Nipple pain. If you think your baby has not latched on correctly, slip your finger into the corner of your baby's mouth to break the suction and place it between your baby's gums. Attempt breastfeeding initiation again. Signs of Successful Breastfeeding Signs from your baby:   A gradual decrease in the number of sucks or complete cessation of sucking.   Falling asleep.   Relaxation of his or her body.   Retention of a small amount of milk in his or her mouth.   Letting go of your breast by himself or herself. Signs from you:  Breasts that have increased in firmness, weight, and size 1 3 hours after feeding.   Breasts that are softer immediately after breastfeeding.  Increased milk volume, as well as a change in milk consistency and color by  the 5th day of breastfeeding.   Nipples that are not sore, cracked, or bleeding. Signs That Your Baby is Getting Enough Milk  Wetting at least 3 diapers in a 24-hour period. The urine should be clear and pale yellow by age 5 days.  At least 3 stools in a 24-hour period by age 5 days. The stool should be soft and yellow.  At least 3 stools in a 24-hour period by age 7 days. The stool should be seedy and yellow.  No loss of weight greater than 10% of birth weight during the first 3 days of age.  Average weight gain of 4 7 ounces (120 210 mL) per week after age 4 days.  Consistent daily weight gain by age 5 days, without weight loss after the age of 2 weeks. After a feeding, your baby may spit up a small amount. This is common. BREASTFEEDING FREQUENCY AND DURATION Frequent feeding will help you make more milk and can prevent sore nipples and breast engorgement. Breastfeed when you feel the need to reduce   the fullness of your breasts or when your baby shows signs of hunger. This is called "breastfeeding on demand." Avoid introducing a pacifier to your baby while you are working to establish breastfeeding (the first 4 6 weeks after your baby is born). After this time you may choose to use a pacifier. Research has shown that pacifier use during the first year of a baby's life decreases the risk of sudden infant death syndrome (SIDS). Allow your baby to feed on each breast as long as he or she wants. Breastfeed until your baby is finished feeding. When your baby unlatches or falls asleep while feeding from the first breast, offer the second breast. Because newborns are often sleepy in the first few weeks of life, you may need to awaken your baby to get him or her to feed. Breastfeeding times will vary from baby to baby. However, the following rules can serve as a guide to help you ensure that your baby is properly fed:  Newborns (babies 4 weeks of age or younger) may breastfeed every 1 3  hours.  Newborns should not go longer than 3 hours during the day or 5 hours during the night without breastfeeding.  You should breastfeed your baby a minimum of 8 times in a 24-hour period until you begin to introduce solid foods to your baby at around 6 months of age. BREAST MILK PUMPING Pumping and storing breast milk allows you to ensure that your baby is exclusively fed your breast milk, even at times when you are unable to breastfeed. This is especially important if you are going back to work while you are still breastfeeding or when you are not able to be present during feedings. Your lactation consultant can give you guidelines on how long it is safe to store breast milk.  A breast pump is a machine that allows you to pump milk from your breast into a sterile bottle. The pumped breast milk can then be stored in a refrigerator or freezer. Some breast pumps are operated by hand, while others use electricity. Ask your lactation consultant which type will work best for you. Breast pumps can be purchased, but some hospitals and breastfeeding support groups lease breast pumps on a monthly basis. A lactation consultant can teach you how to hand express breast milk, if you prefer not to use a pump.  CARING FOR YOUR BREASTS WHILE YOU BREASTFEED Nipples can become dry, cracked, and sore while breastfeeding. The following recommendations can help keep your breasts moisturized and healthy:  Avoid using soap on your nipples.   Wear a supportive bra. Although not required, special nursing bras and tank tops are designed to allow access to your breasts for breastfeeding without taking off your entire bra or top. Avoid wearing underwire style bras or extremely tight bras.  Air dry your nipples for 3 4minutes after each feeding.   Use only cotton bra pads to absorb leaked breast milk. Leaking of breast milk between feedings is normal.   Use lanolin on your nipples after breastfeeding. Lanolin helps to  maintain your skin's normal moisture barrier. If you use pure lanolin you do not need to wash it off before feeding your baby again. Pure lanolin is not toxic to your baby. You may also hand express a few drops of breast milk and gently massage that milk into your nipples and allow the milk to air dry. In the first few weeks after giving birth, some women experience extremely full breasts (engorgement). Engorgement can make   your breasts feel heavy, warm, and tender to the touch. Engorgement peaks within 3 5 days after you give birth. The following recommendations can help ease engorgement:  Completely empty your breasts while breastfeeding or pumping. You may want to start by applying warm, moist heat (in the shower or with warm water-soaked hand towels) just before feeding or pumping. This increases circulation and helps the milk flow. If your baby does not completely empty your breasts while breastfeeding, pump any extra milk after he or she is finished.  Wear a snug bra (nursing or regular) or tank top for 1 2 days to signal your body to slightly decrease milk production.  Apply ice packs to your breasts, unless this is too uncomfortable for you.  Make sure that your baby is latched on and positioned properly while breastfeeding. If engorgement persists after 48 hours of following these recommendations, contact your health care provider or a lactation consultant. OVERALL HEALTH CARE RECOMMENDATIONS WHILE BREASTFEEDING  Eat healthy foods. Alternate between meals and snacks, eating 3 of each per day. Because what you eat affects your breast milk, some of the foods may make your baby more irritable than usual. Avoid eating these foods if you are sure that they are negatively affecting your baby.  Drink milk, fruit juice, and water to satisfy your thirst (about 10 glasses a day).   Rest often, relax, and continue to take your prenatal vitamins to prevent fatigue, stress, and anemia.  Continue  breast self-awareness checks.  Avoid chewing and smoking tobacco.  Avoid alcohol and drug use. Some medicines that may be harmful to your baby can pass through breast milk. It is important to ask your health care provider before taking any medicine, including all over-the-counter and prescription medicine as well as vitamin and herbal supplements. It is possible to become pregnant while breastfeeding. If birth control is desired, ask your health care provider about options that will be safe for your baby. SEEK MEDICAL CARE IF:   You feel like you want to stop breastfeeding or have become frustrated with breastfeeding.  You have painful breasts or nipples.  Your nipples are cracked or bleeding.  Your breasts are red, tender, or warm.  You have a swollen area on either breast.  You have a fever or chills.  You have nausea or vomiting.  You have drainage other than breast milk from your nipples.  Your breasts do not become full before feedings by the 5th day after you give birth.  You feel sad and depressed.  Your baby is too sleepy to eat well.  Your baby is having trouble sleeping.   Your baby is wetting less than 3 diapers in a 24-hour period.  Your baby has less than 3 stools in a 24-hour period.  Your baby's skin or the white part of his or her eyes becomes yellow.   Your baby is not gaining weight by 5 days of age. SEEK IMMEDIATE MEDICAL CARE IF:   Your baby is overly tired (lethargic) and does not want to wake up and feed.  Your baby develops an unexplained fever. Document Released: 02/02/2005 Document Revised: 10/05/2012 Document Reviewed: 07/27/2012 [employer] Patient Information 2014 [employer]. Vaginal Delivery During delivery, your health care provider will help you give birth to your baby. During a vaginal delivery, you will work to push the baby out of your vagina. However, before you can push your baby out, a few things need to happen. The opening of  your uterus (cervix) has   to soften, thin out, and open up (dilate) all the way to 10 cm. Also, your baby has to move down from the uterus into your vagina.  SIGNS OF LABOR  Your health care provider will first need to make sure you are in labor. Signs of labor include:   Passing what is called the mucous plug before labor begins. This is a small amount of blood-stained mucus.   Having regular, painful uterine contractions.   The time between contractions gets shorter.   The discomfort and pain gradually get more intense.  Contraction pains get worse when walking and do not go away when resting.   Your cervix becomes thinner (effacement) and dilates. BEFORE THE DELIVERY Once you are in labor and admitted into the hospital or care center, your health care provider may do the following:   Perform a complete physical exam.  Review any complications related to pregnancy or labor.  Check your blood pressure, pulse, temperature, and heart rate (vital signs).   Determine if, and when, the rupture of amniotic membranes occurred.  Do a vaginal exam (using a sterile glove and lubricant) to determine:   The position (presentation) of the baby. Is the baby's head presenting first (vertex) in the birth canal (vagina), or are the feet or buttocks first (breech)?   The level (station) of the baby's head within the birth canal.   The effacement and dilatation of the cervix.   An electronic fetal monitor is usually placed on your abdomen when you first arrive. This is used to monitor your contractions and the baby's heart rate.  When the monitor is on your abdomen (external fetal monitor), it can only pick up the frequency and length of your contractions. It cannot tell the strength of your contractions.  If it becomes necessary for your health care provider to know exactly how strong your contractions are or to see exactly what the baby's heart rate is doing, an internal monitor may be  inserted into your vagina and uterus. Your health care provider will discuss the benefits and risks of using an internal monitor and obtain your permission before inserting the device.  Continuous fetal monitoring may be needed if you have an epidural, are receiving certain medicines (such as oxytocin), or have pregnancy or labor complications.  An IV access tube may be placed into a vein in your arm to deliver fluids and medicines if necessary. THREE STAGES OF LABOR AND DELIVERY Normal labor and delivery is divided into three stages. First Stage This stage starts when you begin to contract regularly and your cervix begins to efface and dilate. It ends when your cervix is completely open (fully dilated). The first stage is the longest stage of labor and can last from 3 hours to 15 hours.  Several methods are available to help with labor pain. You and your health care provider will decide which option is best for you. Options include:   Opioid medicines. These are strong pain medicines that you can get through your IV tube or as a shot into your muscle. These medicines lessen pain but do not make it go away completely.  Epidural. A medicine is given through a thin tube that is inserted in your back. The medicine numbs the lower part of your body and prevents any pain in that area.  Paracervical pain medicine. This is an injection of an anesthetic on each side of your cervix.   You may request natural childbirth, which does not involve the use   of pain medicines or an epidural during labor and delivery. Instead, you will use other things, such as breathing exercises, to help cope with the pain. Second Stage The second stage of labor begins when your cervix is fully dilated at 10 cm. It continues until you push your baby down through the birth canal and the baby is born. This stage can take only minutes or several hours.  The location of your baby's head as it moves through the birth canal is  reported as a number called a station. If the baby's head has not started its descent, the station is described as being at minus 3 ( 3). When your baby's head is at the zero station, it is at the middle of the birth canal and is engaged in the pelvis. The station of your baby helps indicate the progress of the second stage of labor.  When your baby is born, your health care provider may hold the baby with his or her head lowered to prevent amniotic fluid, mucus, and blood from getting into the baby's lungs. The baby's mouth and nose may be suctioned with a small bulb syringe to remove any additional fluid.  Your health care provider may then place the baby on your stomach. It is important to keep the baby from getting cold. To do this, the health care provider will dry the baby off, place the baby directly on your skin (with no blankets between you and the baby), and cover the baby with warm, dry blankets.   The umbilical cord is cut. Third Stage During the third stage of labor, your health care provider will deliver the placenta (afterbirth) and make sure your bleeding is under control. The delivery of the placenta usually takes about 5 minutes but can take up to 30 minutes. After the placenta is delivered, a medicine may be given either by IV or injection to help contract the uterus and control bleeding. If you are planning to breastfeed, you can try to do so now. After you deliver the placenta, your uterus should contract and get very firm. If your uterus does not remain firm, your health care provider will massage it. This is important because the contraction of the uterus helps cut off bleeding at the site where the placenta was attached to your uterus. If your uterus does not contract properly and stay firm, you may continue to bleed heavily. If there is a lot of bleeding, medicines may be given to contract the uterus and stop the bleeding.  Document Released: 11/12/2007 Document Revised:  10/05/2012 Document Reviewed: 07/24/2012 [employer] Patient Information 2014 [employer].  

## 2013-06-06 NOTE — Anesthesia Procedure Notes (Signed)
Epidural Patient location during procedure: OB Start time: 06/06/2013 3:50 PM End time: 06/06/2013 3:54 PM  Staffing Anesthesiologist: Leilani AbleHATCHETT, Quantrell Splitt Performed by: anesthesiologist   Preanesthetic Checklist Completed: patient identified, surgical consent, pre-op evaluation, timeout performed, IV checked, risks and benefits discussed and monitors and equipment checked  Epidural Patient position: sitting Prep: site prepped and draped and DuraPrep Patient monitoring: continuous pulse ox and blood pressure Approach: midline Location: L3-L4 Injection technique: LOR air  Needle:  Needle type: Tuohy  Needle gauge: 17 G Needle length: 9 cm and 9 Needle insertion depth: 6 cm Catheter type: closed end flexible Catheter size: 19 Gauge Catheter at skin depth: 11 cm Test dose: negative and Other  Assessment Sensory level: T9 Events: blood not aspirated, injection not painful, no injection resistance, negative IV test and no paresthesia  Additional Notes Reason for block:procedure for pain

## 2013-06-06 NOTE — Progress Notes (Signed)
VE shows her to be 6-7 with BBOW--direct admit to Labor and delivery--report to L & D and Sid FalconWalidah Muhammad, CNM called.

## 2013-06-06 NOTE — OB Triage Provider Note (Signed)
Sarah Klein is a 33 y.o. female [phone] 68102w4d presenting for active labor.  Maternal Medical History:  Reason for admission: Contractions.   Contractions: Onset was 6-12 hours ago.   Frequency: regular.   Perceived severity is moderate.    Fetal activity: Perceived fetal activity is normal.   Last perceived fetal movement was within the past hour.    Prenatal complications: No bleeding, cholelithiasis, HIV, hypertension, infection, IUGR, nephrolithiasis, oligohydramnios, placental abnormality, polyhydramnios, pre-eclampsia, preterm labor, substance abuse, thrombocytopenia or thrombophilia.     OB History   Grav Para Term Preterm Abortions TAB SAB Ect Mult Living   3 2 2  0      2     Past Medical History  Diagnosis Date  . [id number](914.7Headache(784.0)    Past Surgical History  Procedure Laterality Date  . Mouth surgery     Family History: family history includes Diabetes in her maternal grandmother; Migraines in her mother. Social History:  reports that she quit smoking about 6 months ago. Her smoking use included Cigarettes. She smoked 1.00 pack per day. She has never used smokeless tobacco. She reports that she drinks alcohol. She reports that she does not use illicit drugs.   Prenatal Transfer Tool    Review of Systems  Constitutional: Negative for fever and chills.  Eyes: Negative for blurred vision.  Respiratory: Negative for shortness of breath.   Gastrointestinal: Negative for heartburn.  Neurological: Negative for headaches.    Dilation: 10 Effacement (%): 100 Exam by:: Dr Marily Lenteestrepo, Sarah Klein. cResenzo, rN Blood pressure 116/71, pulse 88, temperature 98.3 F (36.8 C), resp. rate 20, height 5\' 5"  (1.651 m), weight 65.318 kg (144 lb), last menstrual period 09/23/2012, SpO2 100.00%. Exam Physical Exam  Constitutional: She appears well-developed and well-nourished.  Eyes: Conjunctivae are normal.  Cardiovascular: Normal rate, regular rhythm, normal heart sounds and intact  distal pulses.   Respiratory: Effort normal and breath sounds normal.  GI:  Gravid. Nontender. Vertex. Positive fetal movments.  Cervical Exam: 10cm/100% effaced/bulging bag-unable to palpate fetal head  Prenatal labs: ABO, Rh: O/POS/-- (10/13 1043) Antibody: NEG (10/13 1043) Rubella: 4.36 (10/13 1043) RPR: NON REAC (10/13 1043)  HBsAg: NEGATIVE (10/13 1043)  HIV: NON REACTIVE (10/13 1043)  GBS: Negative (04/03 0000)   Assessment/Plan: 33 year old G3P2002 3102w4d admitted for active labor. Now status post epidural. Fetal tracing category I. 1. Plan to AROM   Robyn H Restrepo 06/06/2013, 6:36 PM     Attestation of Attending Supervision of Advanced Practitioner: Evaluation and management procedures were performed by the PA/NP/CNM/OB Fellow under my supervision/collaboration. Chart reviewed and agree with management and plan.  Tilda BurrowJohn V Luisfelipe Engelstad 06/08/2013 8:49 AM

## 2013-06-06 NOTE — Progress Notes (Signed)
Dietrich PatesLarhonda S Harp is a 33 y.o. G3P2002 at 3478w4d by ultrasound admitted for Preterm labor  Subjective:  Not feeling pressure. Comfortable with epidural.  Objective: BP 113/58  Pulse 77  Temp(Src) 98.2 F (36.8 C) (Oral)  Resp 20  Ht 5\' 5"  (1.651 m)  Wt 144 lb (65.318 kg)  BMI 23.96 kg/m2  SpO2 100%  LMP 09/23/2012      FHT:  FHR: 125 bpm, variability: moderate,  accelerations:  Present,  decelerations:  Absent UC:   regular, every 3-4 minutes SVE:   Dilation: 4.5 Effacement (%): 50 Station: -3 Exam by:: Dr Shawnie PonsPratt  Labs: Lab Results  Component Value Date   WBC 13.7* 06/06/2013   HGB 10.4* 06/06/2013   HCT 31.4* 06/06/2013   MCV 90.0 06/06/2013   PLT 204 06/06/2013    Assessment / Plan: Protracted latent phase  Labor: will add pitocin Preeclampsia:  no signs or symptoms of toxicity Fetal Wellbeing:  Category I Pain Control:  Epidural I/D:  n/a Anticipated MOD:  NSVD  Reva Boresanya S Dorrian Doggett 06/06/2013, 9:30 PM

## 2013-06-06 NOTE — Progress Notes (Signed)
Sarah Klein is a 33 y.o. G3P2002 at 966w4d  admitted for active labor  Subjective:   Objective: BP 95/60  Pulse 73  Temp(Src) 97.7 F (36.5 C) (Axillary)  Resp 18  Ht 5\' 5"  (1.651 m)  Wt 65.318 kg (144 lb)  BMI 23.96 kg/m2  SpO2 100%  LMP 09/23/2012      FHT:  FHR: 115-125 bpm, variability: moderate,  accelerations:  Present,  decelerations:  Present Occasional variables  UC:   regular, every 2-3 minutes SVE:   Dilation: 5 Effacement (%): 80 Station: 0 Exam by:: Heather, CNM  Labs: Lab Results  Component Value Date   WBC 13.7* 06/06/2013   HGB 10.4* 06/06/2013   HCT 31.4* 06/06/2013   MCV 90.0 06/06/2013   PLT 204 06/06/2013    Assessment / Plan: Augmentation of labor, progressing well  Labor: Progressing normally Preeclampsia:  NA Fetal Wellbeing:  Category I Pain Control:  Epidural I/D:  n/a Anticipated MOD:  NSVD  Sarah Klein 06/06/2013, 11:39 PM

## 2013-06-06 NOTE — Progress Notes (Signed)
Pt. Returned this pm for check

## 2013-06-06 NOTE — Progress Notes (Signed)
P-[id number] 

## 2013-06-06 NOTE — Progress Notes (Signed)
C/o contractions, vaginal bleeding--h/o prior PTB GBS negative Bloody show on exam--likely early labor--precautions given.

## 2013-06-06 NOTE — Anesthesia Preprocedure Evaluation (Signed)
Anesthesia Evaluation  Patient identified by MRN, date of birth, ID band Patient awake    Reviewed: Allergy & Precautions, H&P , NPO status , Patient's Chart, lab work & pertinent test results  Airway Mallampati: I TM Distance: >3 FB Neck ROM: full    Dental no notable dental hx.    Pulmonary neg pulmonary ROS, former smoker,    Pulmonary exam normal       Cardiovascular negative cardio ROS      Neuro/Psych negative psych ROS   GI/Hepatic negative GI ROS, Neg liver ROS,   Endo/Other  negative endocrine ROS  Renal/GU negative Renal ROS     Musculoskeletal   Abdominal Normal abdominal exam  (+)   Peds  Hematology negative hematology ROS (+)   Anesthesia Other Findings   Reproductive/Obstetrics (+) Pregnancy                           Anesthesia Physical Anesthesia Plan  ASA: II  Anesthesia Plan: Epidural   Post-op Pain Management:    Induction:   Airway Management Planned:   Additional Equipment:   Intra-op Plan:   Post-operative Plan:   Informed Consent: I have reviewed the patients History and Physical, chart, labs and discussed the procedure including the risks, benefits and alternatives for the proposed anesthesia with the patient or authorized representative who has indicated his/her understanding and acceptance.     Plan Discussed with:   Anesthesia Plan Comments:         Anesthesia Quick Evaluation

## 2013-06-07 ENCOUNTER — Encounter (HOSPITAL_COMMUNITY): Payer: Self-pay | Admitting: *Deleted

## 2013-06-07 DIAGNOSIS — Z87891 Personal history of nicotine dependence: Secondary | ICD-10-CM

## 2013-06-07 DIAGNOSIS — Z833 Family history of diabetes mellitus: Secondary | ICD-10-CM

## 2013-06-07 MED ORDER — TETANUS-DIPHTH-ACELL PERTUSSIS 5-2.5-18.5 LF-MCG/0.5 IM SUSP: 0.5000 mL | Freq: Once | INTRAMUSCULAR | Status: DC

## 2013-06-07 MED ORDER — PRENATAL MULTIVITAMIN CH
1.0000 | ORAL_TABLET | Freq: Every day | ORAL | Status: DC
  Administered 2013-06-07 – 2013-06-09 (×3): 1 via ORAL
  Filled 2013-06-07 (×3): qty 1

## 2013-06-07 MED ORDER — SIMETHICONE 80 MG PO CHEW: 80.0000 mg | CHEWABLE_TABLET | ORAL | Status: DC | PRN

## 2013-06-07 MED ORDER — IBUPROFEN 600 MG PO TABS
600.0000 mg | ORAL_TABLET | Freq: Four times a day (QID) | ORAL | Status: DC
  Administered 2013-06-07 – 2013-06-09 (×10): 600 mg via ORAL
  Filled 2013-06-07 (×10): qty 1

## 2013-06-07 MED ORDER — LANOLIN HYDROUS EX OINT: TOPICAL_OINTMENT | CUTANEOUS | Status: DC | PRN

## 2013-06-07 MED ORDER — SENNOSIDES-DOCUSATE SODIUM 8.6-50 MG PO TABS
2.0000 | ORAL_TABLET | ORAL | Status: DC
  Administered 2013-06-08 (×2): 2 via ORAL
  Filled 2013-06-07 (×2): qty 2

## 2013-06-07 MED ORDER — DIPHENHYDRAMINE HCL 25 MG PO CAPS: 25.0000 mg | ORAL_CAPSULE | Freq: Four times a day (QID) | ORAL | Status: DC | PRN

## 2013-06-07 MED ORDER — WITCH HAZEL-GLYCERIN EX PADS: 1.0000 "application " | MEDICATED_PAD | CUTANEOUS | Status: DC | PRN

## 2013-06-07 MED ORDER — ONDANSETRON HCL 4 MG/2ML IJ SOLN: 4.0000 mg | INTRAMUSCULAR | Status: DC | PRN

## 2013-06-07 MED ORDER — ZOLPIDEM TARTRATE 5 MG PO TABS: 5.0000 mg | ORAL_TABLET | Freq: Every evening | ORAL | Status: DC | PRN

## 2013-06-07 MED ORDER — ONDANSETRON HCL 4 MG PO TABS: 4.0000 mg | ORAL_TABLET | ORAL | Status: DC | PRN

## 2013-06-07 MED ORDER — BENZOCAINE-MENTHOL 20-0.5 % EX AERO: 1.0000 "application " | INHALATION_SPRAY | CUTANEOUS | Status: DC | PRN

## 2013-06-07 MED ORDER — OXYCODONE-ACETAMINOPHEN 5-325 MG PO TABS
1.0000 | ORAL_TABLET | ORAL | Status: DC | PRN
  Administered 2013-06-07 – 2013-06-08 (×4): 1 via ORAL
  Filled 2013-06-07 (×4): qty 1

## 2013-06-07 MED ORDER — DIBUCAINE 1 % RE OINT: 1.0000 "application " | TOPICAL_OINTMENT | RECTAL | Status: DC | PRN

## 2013-06-07 NOTE — H&P (Signed)
Sarah Klein is a 33 y.o. female [phone] 7063w4d presenting for active labor.  Maternal Medical History:  Reason for admission: Contractions.   Contractions: Onset was 6-12 hours ago.  Frequency: regular.  Perceived severity is moderate.  Fetal activity: Perceived fetal activity is normal.  Last perceived fetal movement was within the past hour.  Prenatal complications: No bleeding, cholelithiasis, HIV, hypertension, infection, IUGR, nephrolithiasis, oligohydramnios, placental abnormality, polyhydramnios, pre-eclampsia, preterm labor, substance abuse, thrombocytopenia or thrombophilia.   OB History    Grav  Para  Term  Preterm  Abortions  TAB  SAB  Ect  Mult  Living    3  2  2   0       2      Past Medical History   Diagnosis  Date   .  MWUXLKGM(010.2Headache(784.0)     Past Surgical History   Procedure  Laterality  Date   .  Mouth surgery      Family History: family history includes Diabetes in her maternal grandmother; Migraines in her mother.   Social History: reports that she quit smoking about 6 months ago. Her smoking use included Cigarettes. She smoked 1.00 pack per day. She has never used smokeless tobacco. She reports that she drinks alcohol. She reports that she does not use illicit drugs.   Review of Systems  Constitutional: Negative for fever and chills.  Eyes: Negative for blurred vision.  Respiratory: Negative for shortness of breath.  Gastrointestinal: Negative for heartburn.  Neurological: Negative for headaches.   Vitals Blood pressure 116/71, pulse 88, temperature 98.3 F (36.8 C), resp. rate 20, height 5\' 5"  (1.651 m), weight 65.318 kg (144 lb), last menstrual period 09/23/2012, SpO2 100.00%.  Exam  Physical Exam  Constitutional: She appears well-developed and well-nourished.  Eyes: Conjunctivae are normal.  Cardiovascular: Normal rate, regular rhythm, normal heart sounds and intact distal pulses.  Respiratory: Effort normal and breath sounds normal.  GI:  Gravid.  Nontender. Vertex. Positive fetal movments.  Cervical Exam: 10cm/100% effaced/bulging bag-unable to palpate fetal head   Prenatal labs:  ABO, Rh: O/POS/-- (10/13 1043)  Antibody: NEG (10/13 1043)  Rubella: 4.36 (10/13 1043)  RPR: NON REAC (10/13 1043)  HBsAg: NEGATIVE (10/13 1043)  HIV: NON REACTIVE (10/13 1043)  GBS: Negative (04/03 0000)   Assessment/Plan:  33 year old G3P2002 5563w4d admitted for active labor. Now status post epidural. Fetal tracing category I.  1. Plan to AROM

## 2013-06-07 NOTE — Anesthesia Postprocedure Evaluation (Signed)
Anesthesia Post Note  Patient: Dietrich PatesLarhonda S Shorb  Procedure(s) Performed: * No procedures listed *  Anesthesia type: Epidural  Patient location: Mother/Baby  Post pain: Pain level controlled  Post assessment: Post-op Vital signs reviewed  Last Vitals:  Filed Vitals:   06/07/13 0610  BP: 114/76  Pulse: 78  Temp: 36.7 C  Resp: 17    Post vital signs: Reviewed  Level of consciousness:alert  Complications: No apparent anesthesia complications

## 2013-06-07 NOTE — Progress Notes (Signed)
Ur chart review completed.  

## 2013-06-08 NOTE — Clinical Social Work Maternal (Signed)
Clinical Social Work Department PSYCHOSOCIAL ASSESSMENT - MATERNAL/CHILD 06/08/2013  Patient:  Sarah Klein, Sarah Klein  Account Number:  [id number]  Admit Date:  06/06/2013  Ardine Eng Name:   Esmond Harps    Clinical Social Worker:  Gerri Spore, LCSW   Date/Time:  06/08/2013 02:36 PM  Date Referred:  06/08/2013   Referral source  CN     Referred reason  Other - See comment   Other referral source:    I:  FAMILY / [place] legal guardian:  PARENT  Guardian - Name Guardian - Age Guardian - Address  Shephanie Romas [address]; [place], [place] [address]  Joshua Robert 33    Other household support members/support persons Name Relationship DOB  Clois Comber DAUGHTER 65 years old  Treanna Dumler SON 63 years old   Other support:    II  PSYCHOSOCIAL DATA Information Source:  Patient Interview  Occupational hygienist Employment:   Museum/gallery curator resources:  Medicaid If [place]:  [employer] Other  Air cabin crew / Grade:   Maternity Care Coordinator / Child Services Coordination / Early Interventions:  Cultural issues impacting care:    III  STRENGTHS Strengths  Adequate Resources  Home prepared for Child (including basic supplies)  Supportive family/friends   Strength comment:    IV  RISK FACTORS AND CURRENT PROBLEMS Current Problem:  YES   Risk Factor & Current Problem Patient Issue Family Issue Risk Factor / Current Problem Comment  Other - See comment Y N Lapse in [place] met with pt to assess reason for (4 month) lapse in [facility]. Pt told CSW that she had regular Medicaid when she initiated [facility].  According to pt, she was told that she was going to start to have to pay out-of-pocket for visits because regular Medicaid did not cover pregnancy related care.  Pt told CSW that she applied for pregnancy Medicaid but still unsure if it was ever approved.  She denies any illegal substance use & verbalized  an understanding of hospital drug testing policy.  UDS & meconium collection is pending.  Pt has all the necessary supplies for the infant & appears to be bonding well.  She identified her family as her primary support system.  FOB involvement seems to be limited, as they did not talk regularly during pregnancy. He is aware that the baby is born & has not called/visited with MOB.  She denies any depressed moods rather describes her feelings as "pissed."  She denies PP depression history.  Pt has applied for [employer] & hopeful that benefits will be approved.  Her mother is caring for her other children during hospitalization.  CSW will continue to monitor drug screen results & make a referral if needed.      VI SOCIAL WORK PLAN Social Work Plan  No Further Intervention Required / No Barriers to Discharge   Type of pt/family education:   If child protective services report - county:   If child protective services report - date:   Information/referral to community resources comment:   Other social work plan:

## 2013-06-08 NOTE — Discharge Summary (Signed)
Obstetric Discharge Summary Reason for Admission: onset of labor Prenatal Procedures: none Intrapartum Procedures: spontaneous vaginal delivery and manual extraction of placenta Postpartum Procedures: none Complications-Operative and Postpartum: none Hemoglobin  Date Value Ref Range Status  06/06/2013 10.4* 12.0 - 15.0 g/dL Final     HCT  Date Value Ref Range Status  06/06/2013 31.4* 36.0 - 46.0 % Final    Physical Exam:  General: alert and no distress Lochia: appropriate Uterine Fundus: firm Incision: NA DVT Evaluation: No evidence of DVT seen on physical exam. Negative Homan's sign.  Discharge Diagnoses: Term Pregnancy-delivered  Hospital Course: 33 year old G3P2103 @ 8053w5d was admitted for onset of labor. A viable female was delivered via Vaginal, Spontaneous Delivery.  27 min after the delivery of the infant, placenta was not delivered. Cervix noted to be closed. Placenta delivered via manual removal by Dr. Shawnie PonsPratt, complete and intact. Patient was stable and discharged home on PPD #1. She was bottlefeeding. Plans for Depoprovera for contraception.     Discharge Information: Date: 06/08/2013 Activity: pelvic rest Diet: routine Medications: Ibuprofen Condition: stable Instructions: refer to practice specific booklet Discharge to: home   Newborn Data: Live born female  Birth Weight: 5 lb 10.3 oz (2560 g) APGAR: 7, 9  Home with mother.  Myriam JacobsonRobyn H Restrepo 06/08/2013, 7:49 PM  I have seen this patient and agree with the above resident's note.  LEFTWICH-KIRBY, Maresha Anastos Certified Nurse-Midwife

## 2013-06-09 MED ORDER — IBUPROFEN 600 MG PO TABS: 600.0000 mg | ORAL_TABLET | Freq: Four times a day (QID) | ORAL | Status: DC

## 2013-06-09 NOTE — Discharge Instructions (Signed)

## 2013-06-09 NOTE — Discharge Summary (Signed)
Obstetric Discharge Summary Reason for Admission: onset of labor Prenatal Procedures: ultrasound Intrapartum Procedures: spontaneous vaginal delivery Postpartum Procedures: none Complications-Operative and Postpartum: none Hemoglobin  Date Value Ref Range Status  06/06/2013 10.4* 12.0 - 15.0 g/dL Final     HCT  Date Value Ref Range Status  06/06/2013 31.4* 36.0 - 46.0 % Final    Physical Exam:  General: alert, cooperative and no distress Lochia: appropriate Uterine Fundus: firm DVT Evaluation: No evidence of DVT seen on physical exam.  Discharge Diagnoses: Preterm pregnancy, delivered  Discharge Information: Date: 06/09/2013 Activity: pelvic rest Diet: routine Medications: Ibuprofen Condition: stable Instructions: refer to practice specific booklet Discharge to: home Follow-up Information   [facility] 4 weeks. (may get depo in a week or so)    Specialty:  Obstetrics and Gynecology   Contact information:   [address] [place] [address] [phone]      Newborn Data: Live born female  Birth Weight: 5 lb 10.3 oz (2560 g) APGAR: 7, 9  Home with mother.  Jacklyn ShellFrances Cresenzo-Dishmon 06/09/2013, 7:57 AM

## 2013-06-13 ENCOUNTER — Encounter: Payer: Medicaid Other | Admitting: Obstetrics & Gynecology

## 2013-06-16 ENCOUNTER — Other Ambulatory Visit (INDEPENDENT_AMBULATORY_CARE_PROVIDER_SITE_OTHER): Payer: Medicaid Other | Admitting: *Deleted

## 2013-06-16 DIAGNOSIS — Z3049 Encounter for surveillance of other contraceptives: Secondary | ICD-10-CM

## 2013-06-16 DIAGNOSIS — Z30013 Encounter for initial prescription of injectable contraceptive: Secondary | ICD-10-CM

## 2013-06-16 MED ORDER — MEDROXYPROGESTERONE ACETATE 150 MG/ML IM SUSP
150.0000 mg | INTRAMUSCULAR | Status: AC
  Administered 2013-06-16: 150 mg via INTRAMUSCULAR

## 2013-06-16 NOTE — Progress Notes (Signed)
Patient was told in the hospital that she could come here and have her Depo Provera initiated before her post partum visit.  Depo Provera started today and she has taken this for contraception in the past.  She will return to clinic for her 6 weeks visit and again in three months for her next Depo Provera.

## 2013-07-24 ENCOUNTER — Ambulatory Visit (INDEPENDENT_AMBULATORY_CARE_PROVIDER_SITE_OTHER): Payer: Medicaid Other | Admitting: Family Medicine

## 2013-07-24 ENCOUNTER — Encounter: Payer: Self-pay | Admitting: Family Medicine

## 2013-07-24 NOTE — Progress Notes (Signed)
  Subjective:     Sarah Klein is a 33 y.o. female who presents for a postpartum visit. She is 6 weeks postpartum following a spontaneous vaginal delivery. I have fully reviewed the prenatal and intrapartum course. The delivery was at 36.5 gestational weeks. Outcome: spontaneous vaginal delivery. Anesthesia: epidural. Postpartum course has been uneventful. Baby's course has been remarkable for unknown diagnosis of Tetrology of Fallot. Baby is feeding by bottle - Sarah Klein. Bleeding no bleeding. Bowel function is normal. Bladder function is normal. Patient is not sexually active. Contraception method is none. Postpartum depression screening: negative.  The following portions of the patient's history were reviewed and updated as appropriate: allergies, current medications, past family history, past medical history, past social history, past surgical history and problem list.  Review of Systems A comprehensive review of systems was negative.   Objective:    BP 129/94  Pulse 80  Ht 5\' 5"  (1.651 m)  Wt 125 lb (56.7 kg)  BMI 20.80 kg/m2  LMP 07/17/2013  Breastfeeding? No  General:  alert, cooperative and appears stated age  Abdomen: soft, non-tender; bowel sounds normal; no masses,  no organomegaly   Vulva:  normal  Vagina: normal vagina  Cervix:  multiparous appearance and no lesions  Corpus: normal size, contour, position, consistency, mobility, non-tender  Adnexa:  normal adnexa        Assessment:     Normal postpartum exam. Pap smear not done at today's visit.   Plan:    1. Contraception: Depo-Provera injections 2. . Follow up in: 1 year or as needed.

## 2013-09-14 ENCOUNTER — Ambulatory Visit: Payer: Medicaid Other

## 2013-09-20 ENCOUNTER — Ambulatory Visit: Payer: Medicaid Other

## 2013-12-18 ENCOUNTER — Encounter: Payer: Self-pay | Admitting: Family Medicine

## 2013-12-19 ENCOUNTER — Ambulatory Visit: Payer: Medicaid Other

## 2013-12-20 ENCOUNTER — Ambulatory Visit (INDEPENDENT_AMBULATORY_CARE_PROVIDER_SITE_OTHER): Payer: Medicaid Other | Admitting: *Deleted

## 2013-12-20 DIAGNOSIS — Z23 Encounter for immunization: Secondary | ICD-10-CM

## 2013-12-20 DIAGNOSIS — Z3042 Encounter for surveillance of injectable contraceptive: Secondary | ICD-10-CM

## 2013-12-20 DIAGNOSIS — Z01812 Encounter for preprocedural laboratory examination: Secondary | ICD-10-CM

## 2013-12-20 MED ORDER — MEDROXYPROGESTERONE ACETATE 150 MG/ML IM SUSP
150.0000 mg | Freq: Once | INTRAMUSCULAR | Status: AC
  Administered 2013-12-20: 150 mg via INTRAMUSCULAR

## 2014-03-21 ENCOUNTER — Ambulatory Visit (INDEPENDENT_AMBULATORY_CARE_PROVIDER_SITE_OTHER): Payer: Medicaid Other | Admitting: *Deleted

## 2014-03-21 ENCOUNTER — Ambulatory Visit: Payer: Medicaid Other

## 2014-03-21 DIAGNOSIS — Z3042 Encounter for surveillance of injectable contraceptive: Secondary | ICD-10-CM

## 2014-03-21 MED ORDER — MEDROXYPROGESTERONE ACETATE 150 MG/ML IM SUSP
150.0000 mg | Freq: Once | INTRAMUSCULAR | Status: AC
  Administered 2014-03-21: 150 mg via INTRAMUSCULAR

## 2014-03-21 NOTE — Progress Notes (Signed)
Pt came in today to get her Depo Provera.

## 2014-06-14 ENCOUNTER — Ambulatory Visit: Payer: Medicaid Other

## 2015-07-08 ENCOUNTER — Emergency Department (HOSPITAL_COMMUNITY)
Admission: EM | Admit: 2015-07-08 | Discharge: 2015-07-08 | Disposition: A | Discharge status: Home / Self Care | Payer: Medicaid Other | Attending: Emergency Medicine | Admitting: Emergency Medicine

## 2015-07-08 ENCOUNTER — Encounter (HOSPITAL_COMMUNITY): Payer: Self-pay | Admitting: *Deleted

## 2015-07-08 DIAGNOSIS — Z87891 Personal history of nicotine dependence: Secondary | ICD-10-CM | POA: Diagnosis not present

## 2015-07-08 DIAGNOSIS — S29012A Strain of muscle and tendon of back wall of thorax, initial encounter: Secondary | ICD-10-CM | POA: Insufficient documentation

## 2015-07-08 DIAGNOSIS — Z3202 Encounter for pregnancy test, result negative: Secondary | ICD-10-CM | POA: Insufficient documentation

## 2015-07-08 DIAGNOSIS — M546 Pain in thoracic spine: Secondary | ICD-10-CM | POA: Diagnosis present

## 2015-07-08 DIAGNOSIS — X58XXXA Exposure to other specified factors, initial encounter: Secondary | ICD-10-CM | POA: Insufficient documentation

## 2015-07-08 DIAGNOSIS — Y9389 Activity, other specified: Secondary | ICD-10-CM | POA: Insufficient documentation

## 2015-07-08 DIAGNOSIS — Y998 Other external cause status: Secondary | ICD-10-CM | POA: Insufficient documentation

## 2015-07-08 DIAGNOSIS — Z79899 Other long term (current) drug therapy: Secondary | ICD-10-CM | POA: Insufficient documentation

## 2015-07-08 DIAGNOSIS — Y9289 Other specified places as the place of occurrence of the external cause: Secondary | ICD-10-CM | POA: Insufficient documentation

## 2015-07-08 LAB — URINALYSIS, ROUTINE W REFLEX MICROSCOPIC
BILIRUBIN URINE: NEGATIVE
GLUCOSE, UA: NEGATIVE mg/dL
Hgb urine dipstick: NEGATIVE
KETONES UR: 15 mg/dL — AB
Nitrite: NEGATIVE
PH: 6 (ref 5.0–8.0)
Protein, ur: NEGATIVE mg/dL
SPECIFIC GRAVITY, URINE: 1.031 — AB (ref 1.005–1.030)

## 2015-07-08 LAB — URINE MICROSCOPIC-ADD ON
Bacteria, UA: NONE SEEN
RBC / HPF: NONE SEEN RBC/hpf (ref 0–5)

## 2015-07-08 LAB — POC URINE PREG, ED: Preg Test, Ur: NEGATIVE

## 2015-07-08 MED ORDER — IBUPROFEN 600 MG PO TABS: 600.0000 mg | ORAL_TABLET | Freq: Three times a day (TID) | ORAL | Status: DC

## 2015-07-08 MED ORDER — CYCLOBENZAPRINE HCL 5 MG PO TABS: 5.0000 mg | ORAL_TABLET | Freq: Three times a day (TID) | ORAL | Status: DC | PRN

## 2015-07-08 MED ORDER — IBUPROFEN 400 MG PO TABS
600.0000 mg | ORAL_TABLET | Freq: Once | ORAL | Status: AC
  Administered 2015-07-08: 600 mg via ORAL
  Filled 2015-07-08: qty 1

## 2015-07-08 MED ORDER — CYCLOBENZAPRINE HCL 10 MG PO TABS
5.0000 mg | ORAL_TABLET | Freq: Once | ORAL | Status: AC
  Administered 2015-07-08: 5 mg via ORAL
  Filled 2015-07-08: qty 1

## 2015-07-08 NOTE — Discharge Instructions (Signed)
Muscle Strain A muscle strain (pulled muscle) happens when a muscle is stretched beyond normal length. It happens when a sudden, violent force stretches your muscle too far. Usually, a few of the fibers in your muscle are torn. Muscle strain is common in athletes. Recovery usually takes 1-2 weeks. Complete healing takes 5-6 weeks.  HOME CARE   Follow the PRICE method of treatment to help your injury get better. Do this the first 2-3 days after the injury:  Protect. Protect the muscle to keep it from getting injured again.  Rest. Limit your activity and rest the injured body part.  Ice. Put ice in a plastic bag. Place a towel between your skin and the bag. Then, apply the ice and leave it on from 15-20 minutes each hour. After the third day, switch to moist heat packs.  Compression. Use a splint or elastic bandage on the injured area for comfort. Do not put it on too tightly.  Elevate. Keep the injured body part above the level of your heart.  Only take medicine as told by your doctor.  Warm up before doing exercise to prevent future muscle strains. GET HELP IF:   You have more pain or puffiness (swelling) in the injured area.  You feel numbness, tingling, or notice a loss of strength in the injured area. MAKE SURE YOU:   Understand these instructions.  Will watch your condition.  Will get help right away if you are not doing well or get worse.   This information is not intended to replace advice given to you by your health care provider. Make sure you discuss any questions you have with your health care provider.   Document Released: 11/12/2007 Document Revised: 11/23/2012 Document Reviewed: 09/01/2012 Elsevier Interactive Patient Education 2016 [employer] You have been given a medication called Flexeril, which is a muscle relaxer.  Please take this as needed up to 3 times a day.  I also like you to take ibuprofen on a regular basis for at least 1 week.  This is a potent  anti-inflammatory medication  Follow-up with your primary care physician as needed

## 2015-07-08 NOTE — ED Notes (Signed)
Received a call from [facility] stating that sample was sent onto main lab from mini lab. Informed individual that test is ordered as POC and should come back to [facility]. She states that she will call main lab and have sample returned to mini lab.

## 2015-07-08 NOTE — ED Notes (Signed)
Pt states mid back pain for about a week. No meds prior to arrival, no injury.

## 2015-07-08 NOTE — ED Provider Notes (Signed)
CSN: [id number]     Arrival date & time 07/08/15  0007 History   First MD Initiated Contact with Patient 07/08/15 0041     Chief Complaint  Patient presents with  . Back Pain     (Consider location/radiation/quality/duration/timing/severity/associated sxs/prior Treatment) HPI Comments: Patient statse intermittent pain under the left scapula, worse with twisting occasionally will radiate to her lower back.  She's not taken any medication nor tried any therapies to decrease her discomfort. Denies any shortness of breath.  She is not on any hormone therapy, no recent travel.  She is not smoker  Patient is a 35 y.o. female presenting with back pain. The history is provided by the patient.  Back Pain Location:  Thoracic spine Quality:  Aching Radiates to:  Does not radiate Pain severity:  Mild Onset quality:  Unable to specify Duration:  3 weeks Timing:  Intermittent Progression:  Unchanged Chronicity:  New Context: twisting   Relieved by:  None tried Worsened by:  Twisting Ineffective treatments:  None tried Associated symptoms: no chest pain, no fever, no numbness and no weakness     Past Medical History  Diagnosis Date  . NWGNFAOZ(308.6Headache(784.0)    Past Surgical History  Procedure Laterality Date  . Mouth surgery     Family History  Problem Relation Age of Onset  . Diabetes Maternal Grandmother   . Migraines Mother    Social History  Substance Use Topics  . Smoking status: Former Smoker -- 1.00 packs/day    Types: Cigarettes    Quit date: 11/14/2012  . Smokeless tobacco: Never Used  . Alcohol Use: Yes     Comment: occ   OB History    Gravida Para Term Preterm AB TAB SAB Ectopic Multiple Living   3 3 2 1      3      Review of Systems  Constitutional: Negative for fever.  Respiratory: Negative for cough and shortness of breath.   Cardiovascular: Negative for chest pain.  Musculoskeletal: Positive for back pain.  Neurological: Negative for weakness and numbness.  All  other systems reviewed and are negative.     Allergies  Review of patient's allergies indicates no known allergies.  Home Medications   Prior to Admission medications   Medication Sig Start Date End Date Taking? Authorizing Provider  cyclobenzaprine (FLEXERIL) 5 MG tablet Take 1 tablet (5 mg total) by mouth 3 (three) times daily as needed for muscle spasms. 07/08/15   Earley FavorGail Landynn Dupler, NP  ibuprofen (ADVIL,MOTRIN) 600 MG tablet Take 1 tablet (600 mg total) by mouth 3 (three) times daily. 07/08/15   Earley FavorGail Tamila Gaulin, NP  Multiple Vitamin (MULTIVITAMIN) capsule Take 1 capsule by mouth daily.    Historical Provider, MD   BP 106/61 mmHg  Pulse 78  Temp(Src) 98.2 F (36.8 C) (Oral)  Resp 16  Ht 5\' 5"  (1.651 m)  Wt 53.071 kg  BMI 19.47 kg/m2  SpO2 96%  LMP 06/14/2015 Physical Exam  Constitutional: She appears well-developed and well-nourished.  HENT:  Head: Normocephalic.  Eyes: Pupils are equal, round, and reactive to light.  Neck: Normal range of motion.  Cardiovascular: Normal rate.   Pulmonary/Chest: Effort normal and breath sounds normal. No accessory muscle usage. No respiratory distress. She has no decreased breath sounds. She has no wheezes.   She exhibits tenderness.  Abdominal: Soft.  Musculoskeletal: Normal range of motion.  Neurological: She is alert.  Skin: Skin is warm.  Nursing note and vitals reviewed.   ED Course  Procedures (  including critical care time) Labs Review Labs Reviewed  POC URINE PREG, ED  POC URINE PREG, ED    Imaging Review No results found. I have personally reviewed and evaluated these images and lab results as part of my medical decision-making.   EKG Interpretation None      MDM  Patient will be prescribed Flexeril and ibuprofen on a regular basis for the next week.  Follow-up with her primary care physician as needed Final diagnoses:  Strain of thoracic paraspinal muscles excluding T1 and T2 levels, initial encounter          Earley Favor, NP 07/08/15 0401  Shon Baton, MD 07/08/15 1610

## 2015-07-08 NOTE — ED Notes (Signed)
Called [facility] and spoke with Eulis Fosterlberto, who states that sample is being processed now.

## 2015-07-08 NOTE — ED Notes (Signed)
Called mini lab to inquire about POC urine preg. Spoke with [employer]. States test is negative. Kathrine CordsGail S. NP notified.

## 2016-03-16 IMAGING — US US OB LIMITED
1 series · 13 of 14 positions shown · non-contrast
Comparison: none

OBSTETRICS REPORT
                      (Signed Final 06/07/2013 [DATE])

Service(s) Provided
 [HOSPITAL]                                         76815.0
Indications
 Determine fetal presentation using ultrasound
 Spontaneous  Rupture of Membranes - leaking fluid
Fetal Evaluation
 Num Of Fetuses:    1
 Fetal Heart Rate:  124                          bpm
 Cardiac Activity:  Observed
 Presentation:      Cephalic
 Placenta:          Fundal, above cervical os
 Amniotic Fluid
 AFI FV:      Anhydramnios
                                             Larg Pckt:       0  cm
Gestational Age
 LMP:           36w 4d        Date:  09/23/12                 EDD:   06/30/13
 Best:          36w 4d     Det. By:  LMP  (09/23/12)          EDD:   06/30/13
Cervix Uterus Adnexa
 Cervical Length:    5.1      cm
 Cervix:       Normal appearance by translabial scan. Fliud in
               fornices/vagina.
Impression
INDICATION: 32 yr old [id number] at 15w2d with leaking of fluid
 for fetal ultrasound. Remote read.

[Series 1: us ob limited · 13 of 14 slices shown]
[im 1/14]
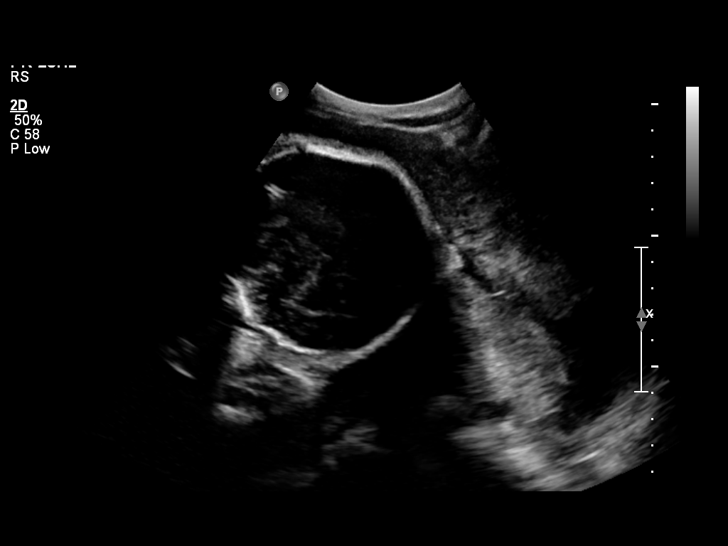
[im 2/14]
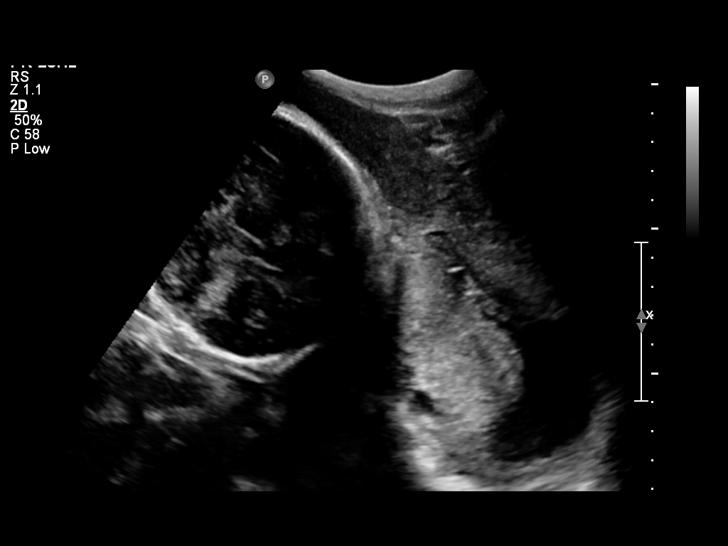
[im 3/14]
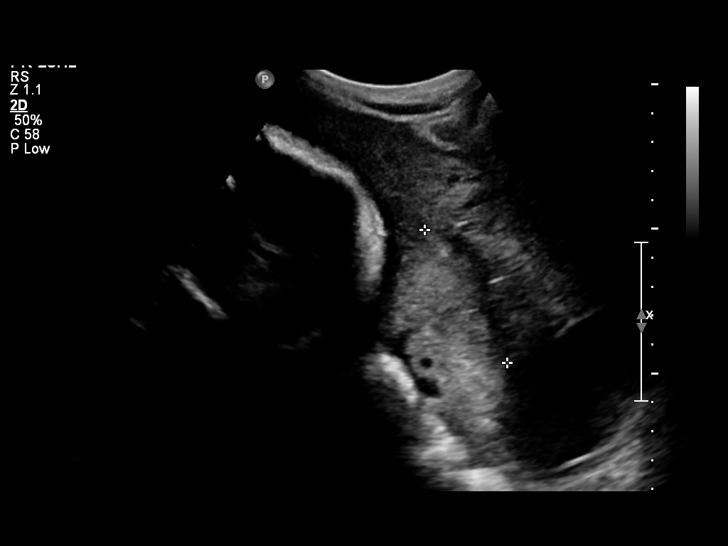
[im 4/14]
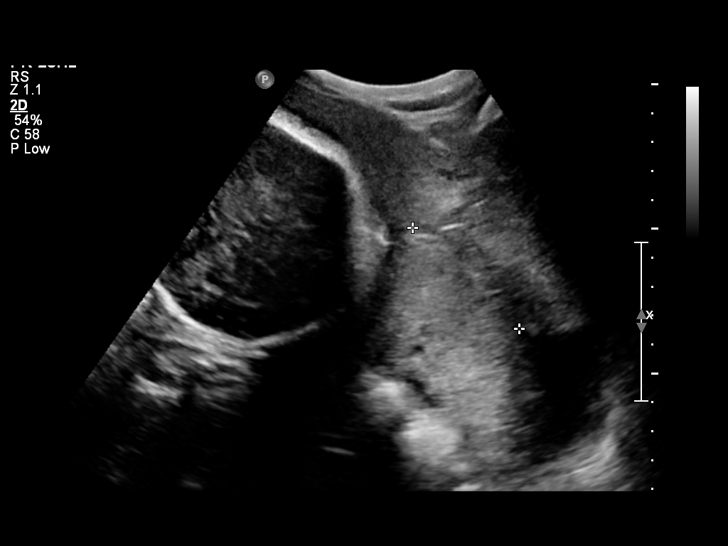
[im 5/14]
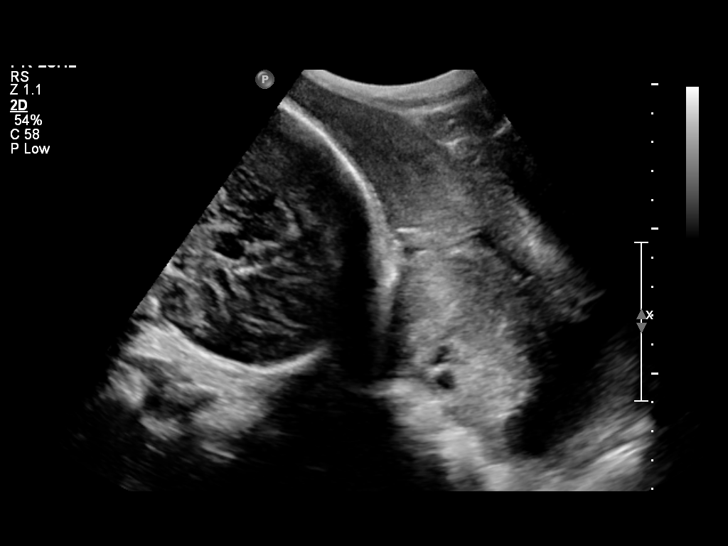
[im 6/14]
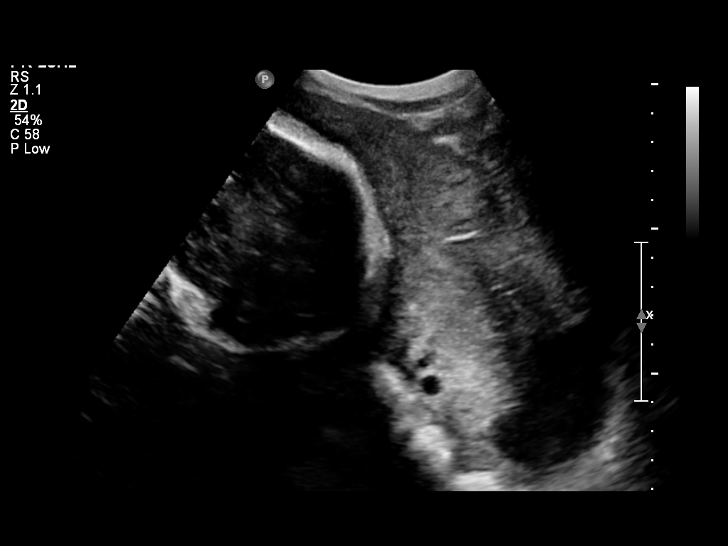
[im 8/14]
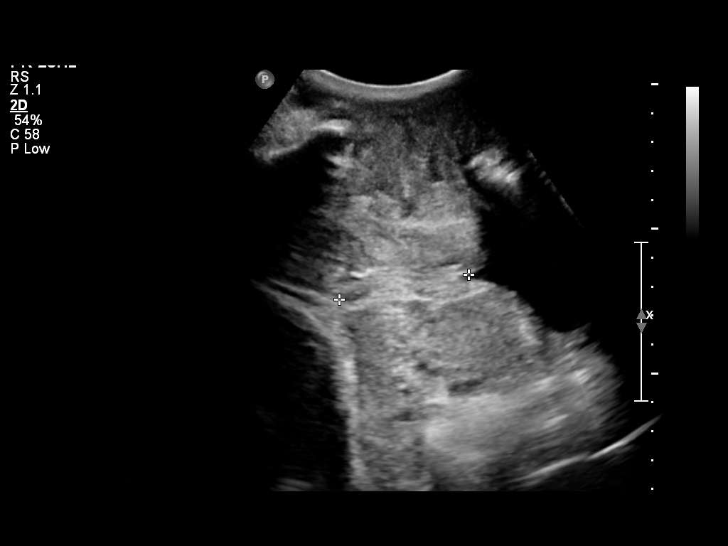
[im 9/14]
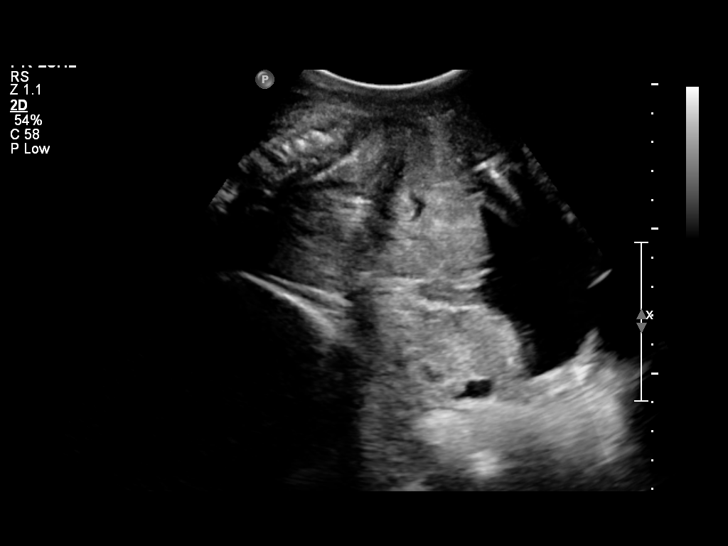
[im 10/14]
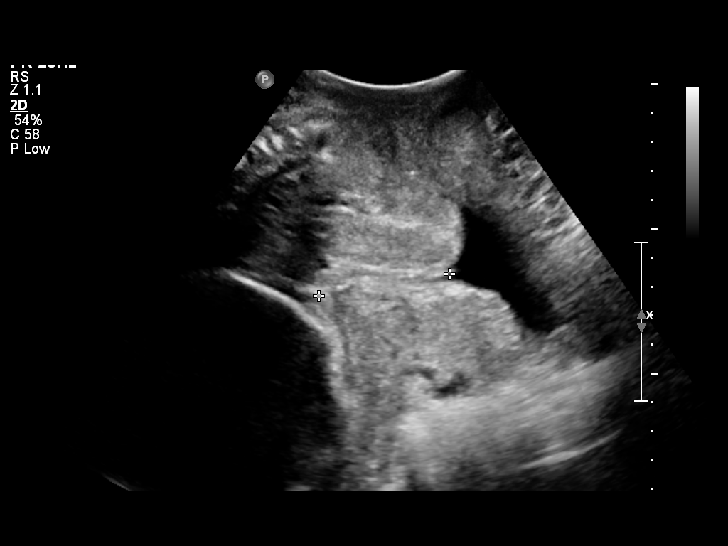
[im 11/14]
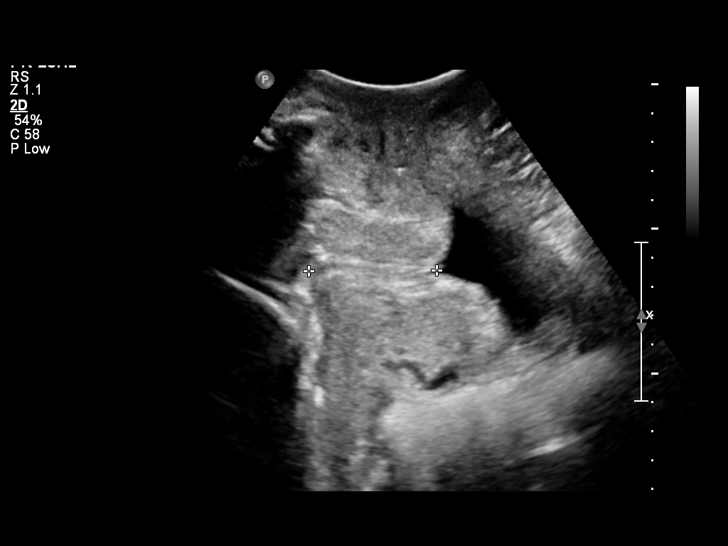
[im 12/14]
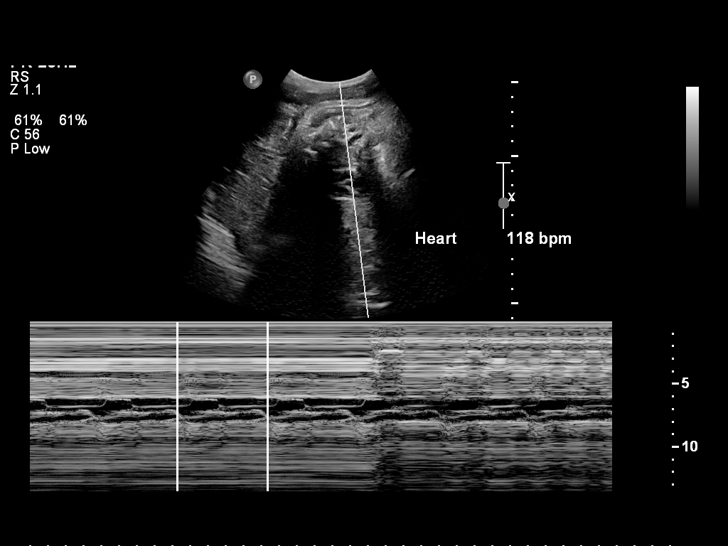
[im 13/14]
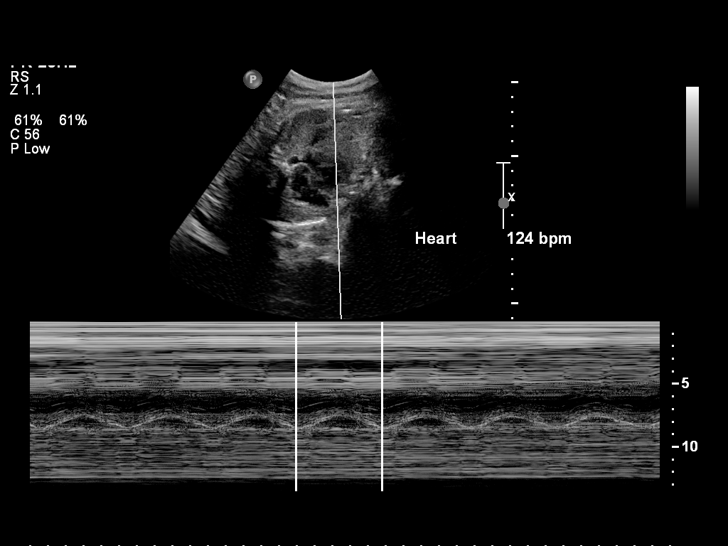
[im 14/14]
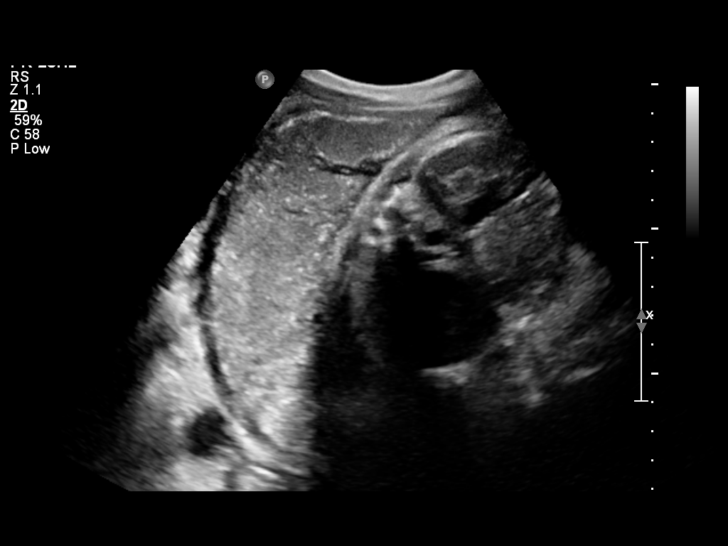

[13 of 14 positions shown; findings below may reference images not displayed]

FINDINGS: 1. Single intrauterine pregnancy.
 2. Fundal placenta without evidence of previa.
 3. Anhydramnios.
 4. Fetus is in cephalic presentation.
Recommendations

 1. Fetus in cephalic presentation.
 2. Anhydramnios.
 3. Management per primary OB.

## 2016-07-05 ENCOUNTER — Inpatient Hospital Stay (HOSPITAL_COMMUNITY)
Admission: AD | Admit: 2016-07-05 | Discharge: 2016-07-05 | Disposition: A | Discharge status: Home / Self Care | Payer: Medicaid Other | Source: Ambulatory Visit | Attending: Obstetrics & Gynecology | Admitting: Obstetrics & Gynecology

## 2016-07-05 ENCOUNTER — Encounter (HOSPITAL_COMMUNITY): Payer: Self-pay | Admitting: *Deleted

## 2016-07-05 DIAGNOSIS — Z87891 Personal history of nicotine dependence: Secondary | ICD-10-CM | POA: Diagnosis not present

## 2016-07-05 DIAGNOSIS — R1030 Lower abdominal pain, unspecified: Secondary | ICD-10-CM

## 2016-07-05 DIAGNOSIS — R109 Unspecified abdominal pain: Secondary | ICD-10-CM | POA: Diagnosis present

## 2016-07-05 DIAGNOSIS — N926 Irregular menstruation, unspecified: Secondary | ICD-10-CM

## 2016-07-05 DIAGNOSIS — N939 Abnormal uterine and vaginal bleeding, unspecified: Secondary | ICD-10-CM | POA: Diagnosis not present

## 2016-07-05 DIAGNOSIS — R1032 Left lower quadrant pain: Secondary | ICD-10-CM | POA: Insufficient documentation

## 2016-07-05 DIAGNOSIS — R1031 Right lower quadrant pain: Secondary | ICD-10-CM | POA: Insufficient documentation

## 2016-07-05 DIAGNOSIS — Z3202 Encounter for pregnancy test, result negative: Secondary | ICD-10-CM

## 2016-07-05 LAB — URINALYSIS, ROUTINE W REFLEX MICROSCOPIC
BILIRUBIN URINE: NEGATIVE
Glucose, UA: NEGATIVE mg/dL
KETONES UR: NEGATIVE mg/dL
NITRITE: NEGATIVE
PH: 5 (ref 5.0–8.0)
PROTEIN: 30 mg/dL — AB
Specific Gravity, Urine: 1.032 — ABNORMAL HIGH (ref 1.005–1.030)

## 2016-07-05 LAB — HCG, QUANTITATIVE, PREGNANCY: HCG, BETA CHAIN, QUANT, S: 6 m[IU]/mL — AB (ref <5)

## 2016-07-05 LAB — POCT PREGNANCY, URINE: Preg Test, Ur: NEGATIVE

## 2016-07-05 NOTE — Progress Notes (Addendum)
G3P2 @ ? States had a + home pregnancy on friday. Presents to triage for bleeding and low abd cramps that started today  Intercourse last night. Started bleeding small amount at approx 1100 this moring. Still bleeding per pt states noted on toilet paper.   UPT negative.   1920: Lab at bs  2112: Provider at bs discussing and explaining to pt results of pregn test.   2115: Discharge instructions given with pt understanding. Pt left unit with SO ambulatory.

## 2016-07-05 NOTE — MAU Provider Note (Signed)
Patient Sarah Klein Is a 36 y.o.  [phone] Unknown Here after a positive home pregnany test on 07-03-2016. Her period was supposed to arrive on MOnday, the 14th. When asked about how she took her test, the patient says that the test was negative for the first three minutes and then she put it up on the shelf. When she came back for it later, it was positive.  Today she had some sharp abdominal pains and light vaginal bleeding; patient is here because she thinks she is miscarrying.    History     CSN: [id number]  Arrival date and time: 07/05/16 [id number]   First Provider Initiated Contact with Patient 07/05/16 1936      Chief Complaint  Patient presents with  . Abdominal Pain  . Vaginal Bleeding   Abdominal Pain  This is a new problem. The current episode started today. The problem occurs intermittently. The pain is located in the LLQ and RLQ. The pain is mild. The quality of the pain is cramping. Nothing aggravates the pain. The pain is relieved by nothing.  Vaginal Bleeding  The patient's pertinent negatives include no genital itching, genital odor, genital rash, pelvic pain or vaginal discharge. This is a new problem. The current episode started today. The problem occurs intermittently. The problem has been resolved. Associated symptoms include abdominal pain.    OB History    Gravida Para Term Preterm AB Living   3 3 2 1   3    SAB TAB Ectopic Multiple Live Births           3      Past Medical History:  Diagnosis Date  . VHQIONGE(952.8Headache(784.0)     Past Surgical History:  Procedure Laterality Date  . MOUTH SURGERY      Family History  Problem Relation Age of Onset  . Diabetes Maternal Grandmother   . Migraines Mother     Social History  Substance Use Topics  . Smoking status: Former Smoker    Packs/day: 1.00    Types: Cigarettes    Quit date: 11/14/2012  . Smokeless tobacco: Never Used  . Alcohol use Yes     Comment: occ    Allergies: No Known  Allergies  Facility-Administered Medications Prior to Admission  Medication Dose Route Frequency Provider Last Rate Last Dose  . medroxyPROGESTERone (DEPO-PROVERA) injection 150 mg  150 mg Intramuscular Q90 days Reva BoresPratt, Tanya S, MD   150 mg at 06/16/13 1051   Prescriptions Prior to Admission  Medication Sig Dispense Refill Last Dose  . cyclobenzaprine (FLEXERIL) 5 MG tablet Take 1 tablet (5 mg total) by mouth 3 (three) times daily as needed for muscle spasms. 30 tablet 0   . ibuprofen (ADVIL,MOTRIN) 600 MG tablet Take 1 tablet (600 mg total) by mouth 3 (three) times daily. 30 tablet 0   . Multiple Vitamin (MULTIVITAMIN) capsule Take 1 capsule by mouth daily.   Taking    Review of Systems  Respiratory: Negative.   Cardiovascular: Negative.   Gastrointestinal: Positive for abdominal pain.  Genitourinary: Positive for vaginal bleeding. Negative for pelvic pain and vaginal discharge.  Neurological: Negative.    Physical Exam   Blood pressure (!) 123/56, pulse (!) 58, temperature 98.3 F (36.8 C), temperature source Oral, resp. rate 16, height 5\' 6"  (1.676 m), weight 122 lb (55.3 kg), last menstrual period 06/01/2016, SpO2 100 %.  Physical Exam  Constitutional: She is oriented to person, place, and time. She appears well-developed and well-nourished.  HENT:  Head: Normocephalic.  Neck: Normal range of motion.  Respiratory: Effort normal and breath sounds normal.  GI: Soft.  Musculoskeletal: Normal range of motion.  Neurological: She is alert and oriented to person, place, and time.  Skin: Skin is warm and dry.      MAU Course  Procedures  MDM UPT negative Beta HCG pending Patient does not want cultures or pelvic exam right now.  Care of patient endorses to [place], [place]  Quant: 6  Discussed with Dr. Despina Hidden. Although technically slightly elevated this is still considered a negative result and does not require further testing.   Assessment and Plan   1. Pregnancy  examination or test, negative result   2. Menstrual period late    D/C home in stable condition.  Follow-up Information    Gynecologist Follow up.   Why:  for non-emergent gynecology care       [facility]  MATERN[facility] ADMISS[facility] Follow up.   Why:  in gynecology emergencies Contact information: [address] [id number] mc [place] [address] [phone]         Allergies as of 07/05/2016   No Known Allergies     Medication List    TAKE these medications   cyclobenzaprine 5 MG tablet Commonly known as:  FLEXERIL Take 1 tablet (5 mg total) by mouth 3 (three) times daily as needed for muscle spasms.   ibuprofen 600 MG tablet Commonly known as:  ADVIL,MOTRIN Take 1 tablet (600 mg total) by mouth 3 (three) times daily.   multivitamin capsule Take 1 capsule by mouth daily.       Charlesetta Garibaldi Kooistra 07/05/2016, 7:47 PM   Katrinka Blazing, [place], [place] 07/05/2016 9:08 PM

## 2016-07-05 NOTE — Discharge Instructions (Signed)
Pregnancy Test Information What is a pregnancy test? A pregnancy test is used to detect the presence of human chorionic gonadotropin (hCG) in a sample of your urine or blood. hCG is a hormone produced by the cells of the placenta. The placenta is the organ that forms to nourish and support a developing baby. This test requires a sample of either blood or urine. A pregnancy test determines whether you are pregnant or not. How are pregnancy tests done? Pregnancy tests are done using a home pregnancy test or having a blood or urine test done at your health care provider's office. Home pregnancy tests require a urine sample.  Most kits use a plastic testing device with a strip of paper that indicates whether there is hCG in your urine.  Follow the test instructions very carefully.  After you urinate on the test stick, markings will appear to let you know whether you are pregnant.  For best results, use your first urine of the morning. That is when the concentration of hCG is highest. Having a blood test to check for pregnancy requires a sample of blood drawn from a vein in your hand or arm. Your health care provider will send your sample to a lab for testing. Results of a pregnancy test will be positive or negative. Is one type of pregnancy test better than another? In some cases, a blood test will return a positive result even if a urine test was negative because blood tests are more sensitive. This means blood tests can detect hCG earlier than home pregnancy tests. How accurate are home pregnancy tests? Both types of pregnancy tests are very accurate.  A blood test is about 98% accurate.  When you are far enough along in your pregnancy and when used correctly, home pregnancy tests are equally accurate. Can anything interfere with home pregnancy test results It is possible for certain conditions to cause an inaccurate test result (false positive or falsenegative).  A false positive is a  positive test result when you are not pregnant. This can happen if you:  Are taking certain medicines, including anticonvulsants or tranquilizers.  Have certain proteins in your blood.  Waiting too long to ready the result  A false negative is a negative test result when you are pregnant. This can happen if you:  Took the test before there was enough hCG to detect. A pregnancy test will not be positive in most women until 3-4 weeks after conception.  Drank a lot of liquid before the test. Diluted urine samples can sometimes give an inaccurate result.  Take certain medicines, such as water pills (diuretics) or some antihistamines. What should I do if I have a positive pregnancy test? If you have a positive pregnancy test, schedule an appointment with your health care provider. You might need additional testing to confirm the pregnancy. In the meantime, begin taking a prenatal vitamin, stop smoking, stop drinking alcohol, and do not use street drugs. Talk to your health care provider about how to take care of yourself during your pregnancy. Ask about what to expect from the care you will need throughout pregnancy (prenatal care). This information is not intended to replace advice given to you by your health care provider. Make sure you discuss any questions you have with your health care provider. Document Released: 02/05/2003 Document Revised: 12/31/2015 Document Reviewed: 05/30/2013 Elsevier Interactive Patient Education  2017 [employer]

## 2016-07-05 NOTE — MAU Note (Signed)
Pt reports she had a positive home preg test 2 days ago and today she started having bleeding and lower abd cramping.

## 2017-02-07 ENCOUNTER — Emergency Department (HOSPITAL_COMMUNITY)
Admission: EM | Admit: 2017-02-07 | Discharge: 2017-02-08 | Disposition: A | Discharge status: Home / Self Care | Payer: Medicaid Other | Attending: Emergency Medicine | Admitting: Emergency Medicine

## 2017-02-07 ENCOUNTER — Encounter (HOSPITAL_COMMUNITY): Payer: Self-pay | Admitting: *Deleted

## 2017-02-07 ENCOUNTER — Other Ambulatory Visit: Payer: Self-pay

## 2017-02-07 DIAGNOSIS — R109 Unspecified abdominal pain: Secondary | ICD-10-CM | POA: Diagnosis present

## 2017-02-07 DIAGNOSIS — Z79899 Other long term (current) drug therapy: Secondary | ICD-10-CM | POA: Diagnosis not present

## 2017-02-07 DIAGNOSIS — M546 Pain in thoracic spine: Secondary | ICD-10-CM | POA: Insufficient documentation

## 2017-02-07 DIAGNOSIS — R110 Nausea: Secondary | ICD-10-CM | POA: Insufficient documentation

## 2017-02-07 DIAGNOSIS — Z87891 Personal history of nicotine dependence: Secondary | ICD-10-CM | POA: Insufficient documentation

## 2017-02-07 DIAGNOSIS — R350 Frequency of micturition: Secondary | ICD-10-CM | POA: Insufficient documentation

## 2017-02-07 LAB — URINALYSIS, ROUTINE W REFLEX MICROSCOPIC
BACTERIA UA: NONE SEEN
Bilirubin Urine: NEGATIVE
GLUCOSE, UA: NEGATIVE mg/dL
Hgb urine dipstick: NEGATIVE
KETONES UR: NEGATIVE mg/dL
Nitrite: NEGATIVE
PROTEIN: NEGATIVE mg/dL
Specific Gravity, Urine: 1.027 (ref 1.005–1.030)
pH: 5 (ref 5.0–8.0)

## 2017-02-07 LAB — COMPREHENSIVE METABOLIC PANEL
ALBUMIN: 3.5 g/dL (ref 3.5–5.0)
ALT: 23 U/L (ref 14–54)
AST: 21 U/L (ref 15–41)
Alkaline Phosphatase: 70 U/L (ref 38–126)
Anion gap: 6 (ref 5–15)
BUN: 11 mg/dL (ref 6–20)
CHLORIDE: 105 mmol/L (ref 101–111)
CO2: 26 mmol/L (ref 22–32)
Calcium: 9.1 mg/dL (ref 8.9–10.3)
Creatinine, Ser: 0.93 mg/dL (ref 0.44–1.00)
GFR calc Af Amer: >60 mL/min (ref >60)
GFR calc non Af Amer: >60 mL/min (ref >60)
GLUCOSE: 92 mg/dL (ref 65–99)
POTASSIUM: 4.2 mmol/L (ref 3.5–5.1)
Sodium: 137 mmol/L (ref 135–145)
Total Bilirubin: 0.5 mg/dL (ref 0.3–1.2)
Total Protein: 6.5 g/dL (ref 6.5–8.1)

## 2017-02-07 LAB — CBC
HEMATOCRIT: 34.4 % — AB (ref 36.0–46.0)
Hemoglobin: 11.4 g/dL — ABNORMAL LOW (ref 12.0–15.0)
MCH: 29.4 pg (ref 26.0–34.0)
MCHC: 33.1 g/dL (ref 30.0–36.0)
MCV: 88.7 fL (ref 78.0–100.0)
Platelets: 268 10*3/uL (ref 150–400)
RBC: 3.88 MIL/uL (ref 3.87–5.11)
RDW: 14.2 % (ref 11.5–15.5)
WBC: 7.9 10*3/uL (ref 4.0–10.5)

## 2017-02-07 LAB — POC URINE PREG, ED: Preg Test, Ur: NEGATIVE

## 2017-02-07 MED ORDER — HYDROCODONE-ACETAMINOPHEN 5-325 MG PO TABS
2.0000 | ORAL_TABLET | Freq: Once | ORAL | Status: AC
  Administered 2017-02-07: 2 via ORAL
  Filled 2017-02-07: qty 2

## 2017-02-07 MED ORDER — ONDANSETRON 4 MG PO TBDP
4.0000 mg | ORAL_TABLET | Freq: Once | ORAL | Status: AC
  Administered 2017-02-07: 4 mg via ORAL
  Filled 2017-02-07: qty 1

## 2017-02-07 NOTE — ED Triage Notes (Signed)
The pt is c/o back pain for one month worse for the past 2 weeks  No bloody urine  Urinary frequency  She thinks she has a uti  lmp dec 16th

## 2017-02-08 ENCOUNTER — Emergency Department (HOSPITAL_COMMUNITY): Payer: Medicaid Other

## 2017-02-08 MED ORDER — ONDANSETRON 4 MG PO TBDP: 4.0000 mg | ORAL_TABLET | Freq: Three times a day (TID) | ORAL | 0 refills | Status: DC | PRN

## 2017-02-08 MED ORDER — IBUPROFEN 800 MG PO TABS: 800.0000 mg | ORAL_TABLET | Freq: Three times a day (TID) | ORAL | 0 refills | Status: DC | PRN

## 2017-02-08 MED ORDER — HYDROCODONE-ACETAMINOPHEN 5-325 MG PO TABS: 2.0000 | ORAL_TABLET | Freq: Four times a day (QID) | ORAL | 0 refills | Status: DC | PRN

## 2017-02-08 NOTE — ED Provider Notes (Signed)
TIME SEEN: 11:10 PM  CHIEF COMPLAINT: Bilateral flank pain  HPI: Patient is a 36 year old female with no significant past medical history who presents to the emergency department with 2 weeks of progressively worsening bilateral flank pain worse on the right side.  She is also had urinary frequency but no dysuria, hematuria.  No vaginal bleeding or discharge.  Pain is described as sharp in nature without radiation.  No aggravating or relieving factors.  No injury to her back.  No midline tenderness.  No numbness, tingling or focal weakness.  No history of kidney stones or kidney infections.  States this feels similar to when she has had urinary tract infections in the past.  She denies fevers or chills.  No abdominal pain.  Has had some mild nausea.  ROS: See HPI Constitutional: no fever  Eyes: no drainage  ENT: no runny nose   Cardiovascular:  no chest pain  Resp: no SOB  GI: no vomiting GU: no dysuria Integumentary: no rash  Allergy: no hives  Musculoskeletal: no leg swelling  Neurological: no slurred speech ROS otherwise negative  PAST MEDICAL HISTORY/PAST SURGICAL HISTORY:  Past Medical History:  Diagnosis Date  . Headache(784.0)     MEDICATIONS:  Prior to Admission medications   Medication Sig Start Date End Date Taking? Authorizing Provider  cyclobenzaprine (FLEXERIL) 5 MG tablet Take 1 tablet (5 mg total) by mouth 3 (three) times daily as needed for muscle spasms. 07/08/15   Earley FavorSchulz, Gail, NP  ibuprofen (ADVIL,MOTRIN) 600 MG tablet Take 1 tablet (600 mg total) by mouth 3 (three) times daily. 07/08/15   Earley FavorSchulz, Gail, NP  Multiple Vitamin (MULTIVITAMIN) capsule Take 1 capsule by mouth daily.    [provider]    ALLERGIES:  No Known Allergies  SOCIAL HISTORY:  Social History   Tobacco Use  . Smoking status: Former Smoker    Packs/day: 1.00    Types: Cigarettes    Last attempt to quit: 11/14/2012    Years since quitting: 4.2  . Smokeless tobacco: Never Used   Substance Use Topics  . Alcohol use: Yes    Comment: occ    FAMILY HISTORY: Family History  Problem Relation Age of Onset  . Diabetes Maternal Grandmother   . Migraines Mother     EXAM: BP (!) 94/45   Pulse 72   Temp 98.3 F (36.8 C) (Oral)   Resp 18   LMP 01/27/2017   SpO2 99%  CONSTITUTIONAL: Alert and oriented and responds appropriately to questions. Well-appearing; well-nourished HEAD: Normocephalic EYES: Conjunctivae clear, pupils appear equal, EOMI ENT: normal nose; moist mucous membranes NECK: Supple, no meningismus, no nuchal rigidity, no LAD  CARD: RRR; S1 and S2 appreciated; no murmurs, no clicks, no rubs, no gallops RESP: Normal chest excursion without splinting or tachypnea; breath sounds clear and equal bilaterally; no wheezes, no rhonchi, no rales, no hypoxia or respiratory distress, speaking full sentences ABD/GI: Normal bowel sounds; non-distended; soft, non-tender, no rebound, no guarding, no peritoneal signs, no hepatosplenomegaly BACK:  The back appears normal without redness, warmth, ecchymosis or swelling noted.  Patient has mild bilateral CVA tenderness, no midline spinal or deformity EXT: Normal ROM in all joints; non-tender to palpation; no edema; normal capillary refill; no cyanosis, no calf tenderness or swelling    SKIN: Normal color for age and race; warm; no rash NEURO: Moves all extremities equally, normal sensation diffusely PSYCH: The patient's mood and manner are appropriate. Grooming and personal hygiene are appropriate.  MEDICAL DECISION MAKING:  Patient here with bilateral flank pain.  Labs, urine pending.  Differential diagnosis includes UTI, pyelonephritis, kidney stone, muscular skeletal back pain.  She is requesting something stronger than Tylenol or ibuprofen for pain control.  We will give her Vicodin and Zofran for symptomatic relief.  ED PROGRESS: Patient's labs are reassuring.  Urine shows no obvious infection but does show calcium  oxalate crystals.  Concern for possible stone.  Will obtain CT of her abdomen and pelvis without contrast.  Urine culture has been sent.  Patient CT scan shows no acute abnormality.  No sign of pyelonephritis, kidney stone.  Suspect musculoskeletal pain.  She states Vicodin helped her significantly.  Will discharge with brief prescription of the same as well as ibuprofen.  Discussed return precautions.  Will give outpatient PCP follow-up.   At this time, I do not feel there is any life-threatening condition present. I have reviewed and discussed all results (EKG, imaging, lab, urine as appropriate) and exam findings with patient/family. I have reviewed nursing notes and appropriate previous records.  I feel the patient is safe to be discharged home without further emergent workup and can continue workup as an outpatient as needed. Discussed usual and customary return precautions. Patient/family verbalize understanding and are comfortable with this plan.  Outpatient follow-up has been provided if needed. All questions have been answered.    Ward, Sarah MawKristen N, DO 02/08/17 0205

## 2017-02-08 NOTE — Discharge Instructions (Signed)
To find a primary care or specialty doctor please call [phone] or [phone] to access "[facility] Find a Doctor Service." ° °You may also go on the [facility] website at [email or web address] ° °There are also multiple [facility], [facility] and [facility] practices throughout the [place] that are frequently accepting new patients. You may find a clinic that is close to your home and contact them. ° °[facility] -  °[address] °[place] [address] °[phone] ° ° °[facility] -  °[address] °[place] [address] [phone] ° ° °[facility] - °[address]  °[place] [address] °[phone] ° ° °

## 2017-02-09 LAB — URINE CULTURE: Culture: NO GROWTH

## 2017-05-01 ENCOUNTER — Encounter (HOSPITAL_COMMUNITY): Payer: Self-pay

## 2017-05-01 ENCOUNTER — Other Ambulatory Visit: Payer: Self-pay

## 2017-05-01 ENCOUNTER — Emergency Department (HOSPITAL_COMMUNITY)
Admission: EM | Admit: 2017-05-01 | Discharge: 2017-05-01 | Disposition: A | Discharge status: Home / Self Care | Payer: Medicaid Other | Attending: Emergency Medicine | Admitting: Emergency Medicine

## 2017-05-01 DIAGNOSIS — R51 Headache: Secondary | ICD-10-CM | POA: Diagnosis not present

## 2017-05-01 DIAGNOSIS — Z5321 Procedure and treatment not carried out due to patient leaving prior to being seen by health care provider: Secondary | ICD-10-CM | POA: Diagnosis not present
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
## 2017-05-01 NOTE — ED Notes (Signed)
Pt stated she would go to another hospital tomorrow and left.

## 2017-05-03 ENCOUNTER — Emergency Department (HOSPITAL_COMMUNITY): Payer: Medicaid Other

## 2017-05-03 ENCOUNTER — Emergency Department (HOSPITAL_COMMUNITY)
Admission: EM | Admit: 2017-05-03 | Discharge: 2017-05-03 | Disposition: A | Discharge status: Home / Self Care | Payer: Medicaid Other | Attending: Emergency Medicine | Admitting: Emergency Medicine

## 2017-05-03 ENCOUNTER — Encounter (HOSPITAL_COMMUNITY): Payer: Self-pay

## 2017-05-03 ENCOUNTER — Other Ambulatory Visit: Payer: Self-pay

## 2017-05-03 DIAGNOSIS — S0242XA Fracture of alveolus of maxilla, initial encounter for closed fracture: Secondary | ICD-10-CM | POA: Diagnosis not present

## 2017-05-03 DIAGNOSIS — Y929 Unspecified place or not applicable: Secondary | ICD-10-CM | POA: Insufficient documentation

## 2017-05-03 DIAGNOSIS — Y999 Unspecified external cause status: Secondary | ICD-10-CM | POA: Insufficient documentation

## 2017-05-03 DIAGNOSIS — Z79899 Other long term (current) drug therapy: Secondary | ICD-10-CM | POA: Diagnosis not present

## 2017-05-03 DIAGNOSIS — Y9389 Activity, other specified: Secondary | ICD-10-CM | POA: Diagnosis not present

## 2017-05-03 MED ORDER — IBUPROFEN 400 MG PO TABS
600.0000 mg | ORAL_TABLET | Freq: Once | ORAL | Status: AC
  Administered 2017-05-03: 600 mg via ORAL
  Filled 2017-05-03: qty 1

## 2017-05-03 MED ORDER — TRAMADOL HCL 50 MG PO TABS: 50.0000 mg | ORAL_TABLET | Freq: Four times a day (QID) | ORAL | 0 refills | Status: DC | PRN
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
## 2017-05-03 NOTE — Discharge Instructions (Addendum)
Please read attached information. If you experience any new or worsening signs or symptoms please return to the emergency room for evaluation. Please follow-up with your primary care provider or specialist as discussed. Please use medication prescribed only as directed and discontinue taking if you have any concerning signs or symptoms.   °
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
## 2018-01-03 ENCOUNTER — Other Ambulatory Visit: Payer: Self-pay

## 2018-01-03 ENCOUNTER — Other Ambulatory Visit (HOSPITAL_COMMUNITY)
Admission: RE | Admit: 2018-01-03 | Discharge: 2018-01-03 | Disposition: A | Discharge status: Home / Self Care | Payer: Medicaid Other | Source: Ambulatory Visit | Attending: Obstetrics and Gynecology | Admitting: Obstetrics and Gynecology

## 2018-01-03 ENCOUNTER — Ambulatory Visit (INDEPENDENT_AMBULATORY_CARE_PROVIDER_SITE_OTHER): Payer: Medicaid Other | Admitting: *Deleted

## 2018-01-03 VITALS — BP 118/90 | HR 80 | Temp 97.6°F | Ht 66.0 in | Wt 121.6 lb

## 2018-01-03 DIAGNOSIS — Z113 Encounter for screening for infections with a predominantly sexual mode of transmission: Secondary | ICD-10-CM | POA: Diagnosis present

## 2018-01-03 LAB — POCT URINALYSIS DIPSTICK OB
BILIRUBIN UA: NEGATIVE
Blood, UA: NEGATIVE
Glucose, UA: NEGATIVE
KETONES UA: NEGATIVE
NITRITE UA: POSITIVE
UROBILINOGEN UA: 0.2 U/dL
pH, UA: 5.5 (ref 5.0–8.0)
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
## 2018-01-04 LAB — URINE CYTOLOGY ANCILLARY ONLY
CHLAMYDIA, DNA PROBE: NEGATIVE
Neisseria Gonorrhea: NEGATIVE

## 2018-01-05 ENCOUNTER — Ambulatory Visit: Payer: Medicaid Other | Admitting: Family Medicine

## 2018-01-05 LAB — URINE CULTURE

## 2018-01-07 ENCOUNTER — Telehealth: Payer: Self-pay | Admitting: *Deleted

## 2018-01-07 DIAGNOSIS — B9620 Unspecified Escherichia coli [E. coli] as the cause of diseases classified elsewhere: Secondary | ICD-10-CM

## 2018-01-07 DIAGNOSIS — R3989 Other symptoms and signs involving the genitourinary system: Secondary | ICD-10-CM

## 2018-01-07 DIAGNOSIS — N390 Urinary tract infection, site not specified: Secondary | ICD-10-CM

## 2018-01-07 MED ORDER — CEPHALEXIN 500 MG PO CAPS: 500.0000 mg | ORAL_CAPSULE | Freq: Two times a day (BID) | ORAL | 0 refills | Status: DC

## 2018-01-07 NOTE — Telephone Encounter (Signed)
-----   Message from Cul[place]itta [place], [place] sent at 01/06/2018  4:39 PM EST ----- Please send Rx for Keflex 500 mg BID x 10 days for asymptomatic UTI.

## 2018-02-02 ENCOUNTER — Encounter: Payer: Self-pay | Admitting: General Practice

## 2018-02-02 ENCOUNTER — Ambulatory Visit: Payer: Medicaid Other | Admitting: Obstetrics and Gynecology

## 2018-05-25 ENCOUNTER — Telehealth: Payer: Self-pay | Admitting: *Deleted

## 2018-05-25 DIAGNOSIS — B9620 Unspecified Escherichia coli [E. coli] as the cause of diseases classified elsewhere: Secondary | ICD-10-CM

## 2018-05-25 DIAGNOSIS — N390 Urinary tract infection, site not specified: Principal | ICD-10-CM

## 2018-05-25 MED ORDER — CEPHALEXIN 500 MG PO CAPS: 500.0000 mg | ORAL_CAPSULE | Freq: Two times a day (BID) | ORAL | 0 refills | Status: DC

## 2018-05-25 NOTE — Telephone Encounter (Signed)
Patient called requesting refill on medication for UTI symptoms. She is having back pain and urine has strong odor. Verbal order given to send medication in by Dr. Macon Large.  Patient advised she will need to have appointment with provider or go to urgent care if symptoms are not relieved by medication.  Clovis Pu, RN

## 2019-08-17 DIAGNOSIS — Z419 Encounter for procedure for purposes other than remedying health state, unspecified: Secondary | ICD-10-CM | POA: Diagnosis not present

## 2019-09-17 DIAGNOSIS — Z419 Encounter for procedure for purposes other than remedying health state, unspecified: Secondary | ICD-10-CM | POA: Diagnosis not present

## 2019-10-18 DIAGNOSIS — Z419 Encounter for procedure for purposes other than remedying health state, unspecified: Secondary | ICD-10-CM | POA: Diagnosis not present

## 2019-11-13 ENCOUNTER — Ambulatory Visit
Admission: EM | Admit: 2019-11-13 | Discharge: 2019-11-13 | Disposition: A | Discharge status: Home / Self Care | Payer: Medicaid Other | Attending: Physician Assistant | Admitting: Physician Assistant

## 2019-11-13 ENCOUNTER — Emergency Department (HOSPITAL_COMMUNITY)
Admission: EM | Admit: 2019-11-13 | Discharge: 2019-11-13 | Disposition: A | Discharge status: Home / Self Care | Payer: Medicaid Other | Attending: Emergency Medicine | Admitting: Emergency Medicine

## 2019-11-13 ENCOUNTER — Encounter (HOSPITAL_COMMUNITY): Payer: Self-pay

## 2019-11-13 ENCOUNTER — Other Ambulatory Visit: Payer: Self-pay

## 2019-11-13 DIAGNOSIS — R197 Diarrhea, unspecified: Secondary | ICD-10-CM | POA: Insufficient documentation

## 2019-11-13 DIAGNOSIS — Z5321 Procedure and treatment not carried out due to patient leaving prior to being seen by health care provider: Secondary | ICD-10-CM | POA: Insufficient documentation

## 2019-11-13 DIAGNOSIS — R110 Nausea: Secondary | ICD-10-CM | POA: Insufficient documentation

## 2019-11-13 DIAGNOSIS — Z20822 Contact with and (suspected) exposure to covid-19: Secondary | ICD-10-CM | POA: Diagnosis not present

## 2019-11-13 DIAGNOSIS — R1030 Lower abdominal pain, unspecified: Secondary | ICD-10-CM | POA: Insufficient documentation

## 2019-11-13 DIAGNOSIS — R1084 Generalized abdominal pain: Secondary | ICD-10-CM | POA: Insufficient documentation

## 2019-11-13 DIAGNOSIS — J029 Acute pharyngitis, unspecified: Secondary | ICD-10-CM | POA: Insufficient documentation

## 2019-11-13 LAB — CBC
HCT: 37.6 % (ref 36.0–46.0)
Hemoglobin: 12.1 g/dL (ref 12.0–15.0)
MCH: 29.1 pg (ref 26.0–34.0)
MCHC: 32.2 g/dL (ref 30.0–36.0)
MCV: 90.4 fL (ref 80.0–100.0)
Platelets: 347 10*3/uL (ref 150–400)
RBC: 4.16 MIL/uL (ref 3.87–5.11)
RDW: 14.1 % (ref 11.5–15.5)
WBC: 15.3 10*3/uL — ABNORMAL HIGH (ref 4.0–10.5)
nRBC: 0 % (ref 0.0–0.2)

## 2019-11-13 LAB — URINALYSIS, ROUTINE W REFLEX MICROSCOPIC
Bilirubin Urine: NEGATIVE
Glucose, UA: NEGATIVE mg/dL
Ketones, ur: NEGATIVE mg/dL
Nitrite: NEGATIVE
Protein, ur: 100 mg/dL — AB
RBC / HPF: >50 RBC/hpf — ABNORMAL HIGH (ref 0–5)
Specific Gravity, Urine: 1.019 (ref 1.005–1.030)
pH: 5 (ref 5.0–8.0)

## 2019-11-13 LAB — COMPREHENSIVE METABOLIC PANEL
ALT: 12 U/L (ref 0–44)
AST: 18 U/L (ref 15–41)
Albumin: 3.1 g/dL — ABNORMAL LOW (ref 3.5–5.0)
Alkaline Phosphatase: 73 U/L (ref 38–126)
Anion gap: 9 (ref 5–15)
BUN: 6 mg/dL (ref 6–20)
CO2: 23 mmol/L (ref 22–32)
Calcium: 9 mg/dL (ref 8.9–10.3)
Chloride: 102 mmol/L (ref 98–111)
Creatinine, Ser: 0.8 mg/dL (ref 0.44–1.00)
GFR calc Af Amer: >60 mL/min (ref >60)
GFR calc non Af Amer: >60 mL/min (ref >60)
Glucose, Bld: 95 mg/dL (ref 70–99)
Potassium: 3.6 mmol/L (ref 3.5–5.1)
Sodium: 134 mmol/L — ABNORMAL LOW (ref 135–145)
Total Bilirubin: 0.6 mg/dL (ref 0.3–1.2)
Total Protein: 6.9 g/dL (ref 6.5–8.1)

## 2019-11-13 LAB — LIPASE, BLOOD: Lipase: 23 U/L (ref 11–51)

## 2019-11-13 LAB — I-STAT BETA HCG BLOOD, ED (MC, WL, AP ONLY): I-stat hCG, quantitative: <5 m[IU]/mL (ref <5)

## 2019-11-13 MED ORDER — DICYCLOMINE HCL 20 MG PO TABS: 20.0000 mg | ORAL_TABLET | Freq: Two times a day (BID) | ORAL | 0 refills | Status: DC

## 2019-11-13 NOTE — ED Notes (Signed)
lwbs

## 2019-11-13 NOTE — ED Provider Notes (Signed)
[facility]    CSN: [id number] Arrival date & time: 11/13/19  1550      History   Chief Complaint Chief Complaint  Patient presents with  . Abdominal Pain    HPI Sarah Klein is a 39 y.o. female.   39 year old female comes in for generalized abdominal pain that started yesterday. Pain is constant, worse with movement, oral intake. Denies nausea, vomiting, diarrhea. Last BM yesterday without straining. Denies urinary changes. Denies vaginal discharge, itching, spotting, bleeding. Denies fever, chills, body aches. Denies rhinorrhea, nasal congestion, cough. Does have a sore throat. Denies shortness of breath, loss of taste/smell. Had unprotected intercourse 1 week ago. LMP 11/12/2019     Past Medical History:  Diagnosis Date  . Headache(784.0)     There are no problems to display for this patient.   Past Surgical History:  Procedure Laterality Date  . MOUTH SURGERY      OB History    Gravida  4   Para  3   Term  2   Preterm  1   AB      Living  3     SAB      TAB      Ectopic      Multiple      Live Births  3            Home Medications    Prior to Admission medications   Medication Sig Start Date End Date Taking? Authorizing Provider  dicyclomine (BENTYL) 20 MG tablet Take 1 tablet (20 mg total) by mouth 2 (two) times daily. 11/13/19   Belinda Fisher, PA-C    Family History Family History  Problem Relation Age of Onset  . Diabetes Maternal Grandmother   . Migraines Mother     Social History Social History   Tobacco Use  . Smoking status: Current Every Day Smoker    Packs/day: 0.75    Types: Cigarettes    Last attempt to quit: 11/14/2012    Years since quitting: 7.0  . Smokeless tobacco: Never Used  Vaping Use  . Vaping Use: Never used  Substance Use Topics  . Alcohol use: Not Currently    Comment: weekends  . Drug use: No     Allergies   Patient has no known allergies.   Review of Systems Review of  Systems  Reason unable to perform ROS: See HPI as above.     Physical Exam Triage Vital Signs ED Triage Vitals  Enc Vitals Group     BP 11/13/19 1628 (!) 101/59     Pulse Rate 11/13/19 1628 88     Resp 11/13/19 1628 16     Temp 11/13/19 1628 98.3 F (36.8 C)     Temp Source 11/13/19 1628 Oral     SpO2 11/13/19 1628 97 %     Weight --      Height --      Head Circumference --      Peak Flow --      Pain Score 11/13/19 1645 10     Pain Loc --      Pain Edu? --      Excl. in GC? --    No data found.  Updated Vital Signs BP (!) 101/59 (BP Location: Left Arm)   Pulse 88   Temp 98.3 F (36.8 C) (Oral)   Resp 16   LMP 11/12/2019   SpO2 97%   Physical Exam Exam conducted with  a chaperone present.  Constitutional:      General: She is not in acute distress.    Appearance: She is well-developed. She is not ill-appearing, toxic-appearing or diaphoretic.  HENT:     Head: Normocephalic and atraumatic.  Eyes:     Conjunctiva/sclera: Conjunctivae normal.     Pupils: Pupils are equal, round, and reactive to light.  Cardiovascular:     Rate and Rhythm: Normal rate and regular rhythm.  Pulmonary:     Effort: Pulmonary effort is normal. No respiratory distress.     Comments: LCTAB Abdominal:     General: Bowel sounds are increased.     Palpations: Abdomen is soft.     Comments: Generalized tenderness to palpation without guarding or rebound. Max tenderness to the suprapubic region. Negative CVA tenderness.  Genitourinary:    Comments: Bleeding noted to the cervix and vaginal canal from menstrual cycle. No discharge noted. Negative CMT. Uterus nontender. No adnexal tenderness.  Musculoskeletal:     Cervical back: Normal range of motion and neck supple.  Skin:    General: Skin is warm and dry.  Neurological:     Mental Status: She is alert and oriented to person, place, and time.  Psychiatric:        Behavior: Behavior normal.        Judgment: Judgment normal.      UC  Treatments / Results  Labs (all labs ordered are listed, but only abnormal results are displayed) Labs Reviewed  NOVEL CORONAVIRUS, NAA  URINE CULTURE  CERVICOVAGINAL ANCILLARY ONLY    EKG   Radiology No results found.  Procedures Procedures (including critical care time)  Medications Ordered in UC Medications - No data to display  Initial Impression / Assessment and Plan / UC Course  I have reviewed the triage vital signs and the nursing notes.  Pertinent labs & imaging results that were available during my care of the patient were reviewed by me and considered in my medical decision making (see chart for details).    Patient was at the ED yesterday but left prior to being seen. CBC, CMP, lipase, UA, hcg obtained at the time. WBC 15.3. UA with small leuks and few bacteria. Currently patient afebrile, nontoxic.  Abdomen soft, increased BS, generalized tenderness without guarding or rebound.  GU exam without worries for PID.  At this time will send for urine culture given patient without urinary symptoms.  Will trial Bentyl.  Push fluids.  Strict return precautions given.  Patient expresses understanding and agrees to plan.   Final Clinical Impressions(s) / UC Diagnoses   Final diagnoses:  Generalized abdominal pain  Sore throat    ED Prescriptions    Medication Sig Dispense Auth. Provider   dicyclomine (BENTYL) 20 MG tablet Take 1 tablet (20 mg total) by mouth 2 (two) times daily. 20 tablet Belinda Fisher, PA-C     PDMP not reviewed this encounter.   Belinda Fisher, PA-C 11/14/19 [phone]

## 2019-11-13 NOTE — ED Triage Notes (Signed)
Pt reports that all day she has been having lower abd pain with nausea and diarrhea, denies fevers.

## 2019-11-13 NOTE — ED Triage Notes (Signed)
Pt c/o abdominal pain since Sunday at 4p. States pain had worsen so went to ED last night and had blood work down. States LWBS d/t waiting time. Denies n/v/d. States also has a sore throat.

## 2019-11-13 NOTE — Discharge Instructions (Signed)
COVID PCR testing ordered. I would like you to quarantine until testing results. As discussed, your exam without worries for infection to the uterus. Cytology sent, you will be contacted with any positive results. You can try bentyl for abdominal pain/cramping. If this worsens, develop fever, nausea/vomiting, go to the emergency department for further evaluation.

## 2019-11-14 LAB — CERVICOVAGINAL ANCILLARY ONLY
Bacterial Vaginitis (gardnerella): POSITIVE — AB
Candida Glabrata: NEGATIVE
Candida Vaginitis: NEGATIVE
Chlamydia: NEGATIVE
Comment: NEGATIVE
Comment: NEGATIVE
Comment: NEGATIVE
Comment: NEGATIVE
Comment: NEGATIVE
Comment: NORMAL
Neisseria Gonorrhea: POSITIVE — AB
Trichomonas: POSITIVE — AB

## 2019-11-14 LAB — NOVEL CORONAVIRUS, NAA: SARS-CoV-2, NAA: NOT DETECTED

## 2019-11-14 LAB — SARS-COV-2, NAA 2 DAY TAT

## 2019-11-15 ENCOUNTER — Ambulatory Visit
Admission: EM | Admit: 2019-11-15 | Discharge: 2019-11-15 | Disposition: A | Discharge status: Home / Self Care | Payer: Medicaid Other

## 2019-11-15 ENCOUNTER — Telehealth (HOSPITAL_COMMUNITY): Payer: Self-pay | Admitting: Emergency Medicine

## 2019-11-15 LAB — URINE CULTURE: Culture: >=100000 — AB

## 2019-11-15 MED ORDER — SULFAMETHOXAZOLE-TRIMETHOPRIM 800-160 MG PO TABS: 1.0000 | ORAL_TABLET | Freq: Two times a day (BID) | ORAL | 0 refills | Status: AC

## 2019-11-15 MED ORDER — METRONIDAZOLE 500 MG PO TABS: 500.0000 mg | ORAL_TABLET | Freq: Two times a day (BID) | ORAL | 0 refills | Status: DC

## 2019-11-15 NOTE — Progress Notes (Signed)
Per protocol, patient will need treatment with Bactrim for UTI, Rocephin IM at clinic, and Flagyl.  Called to make patient aware of results, she states she already saw them on MyChart and does not need me to go over them.  I asked if she wanted me to go over he treatments, she said "just send everything".  I explained that for gonorrhea she will need to return to the clinic for a shot, patient verbalized understanding.  Did not give this RN time to go over STI information before she hung up.  Will send other prescriptions to pharmacy.

## 2019-11-16 ENCOUNTER — Ambulatory Visit
Admission: EM | Admit: 2019-11-16 | Discharge: 2019-11-16 | Disposition: A | Discharge status: Home / Self Care | Payer: Medicaid Other | Attending: Emergency Medicine | Admitting: Emergency Medicine

## 2019-11-16 DIAGNOSIS — A549 Gonococcal infection, unspecified: Secondary | ICD-10-CM

## 2019-11-16 MED ORDER — CEFTRIAXONE SODIUM 500 MG IJ SOLR
500.0000 mg | Freq: Once | INTRAMUSCULAR | Status: AC
  Administered 2019-11-16: 500 mg via INTRAMUSCULAR

## 2019-11-16 NOTE — ED Triage Notes (Signed)
Pt here for Rocephin shot

## 2019-11-17 DIAGNOSIS — Z419 Encounter for procedure for purposes other than remedying health state, unspecified: Secondary | ICD-10-CM | POA: Diagnosis not present

## 2019-11-19 IMAGING — CT CT RENAL STONE PROTOCOL
2 of 4 series · 16 of 46 positions shown, 18 images · non-contrast
Comparison: None available.

CLINICAL DATA: Initial evaluation for acute back pain for 1 month,
worse over past 2 weeks.

EXAM:
CT ABDOMEN AND PELVIS WITHOUT CONTRAST
TECHNIQUE: Multidetector CT imaging of the abdomen and pelvis was performed
following the standard protocol without IV contrast.

[Series 3: renal stone 5.0 · axial · 0.61mm/px · z∈[+736,+1130]mm · 13 of 87 slices shown, 15 images]
[im 4/87  soft-tissue]
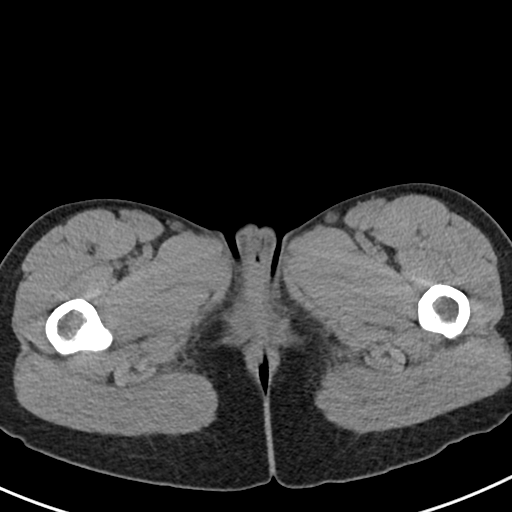
[im 4/87  bone]
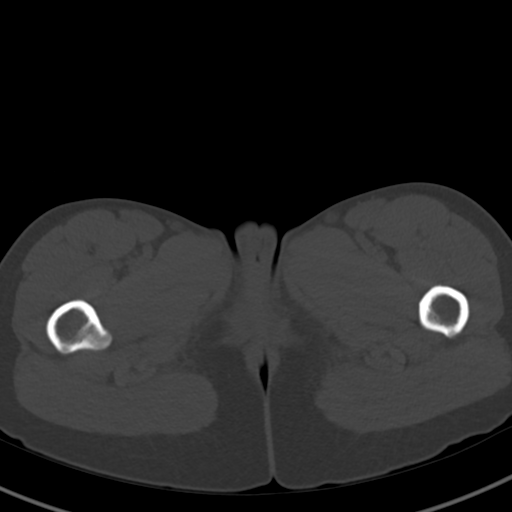
[im 11/87  soft-tissue]
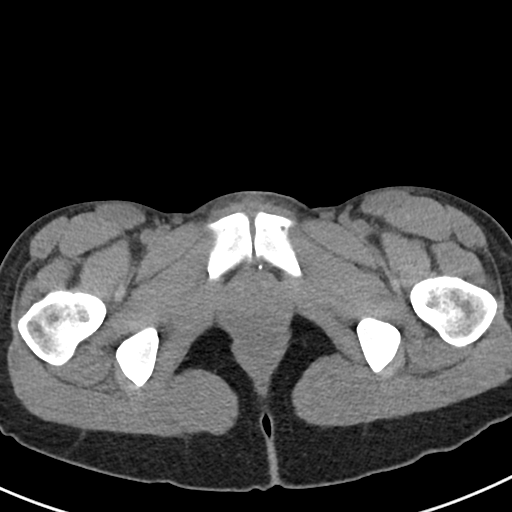
[im 18/87  soft-tissue]
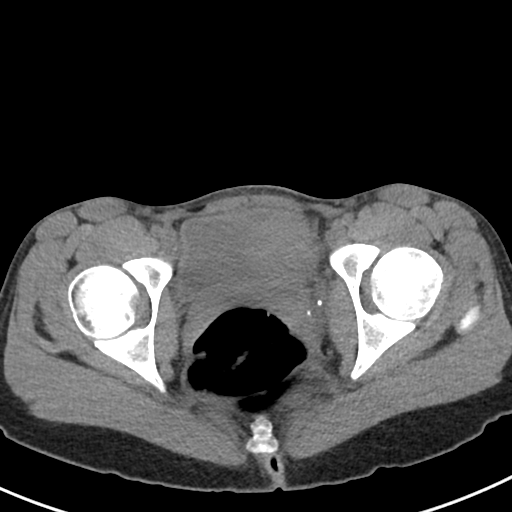
[im 25/87  soft-tissue]
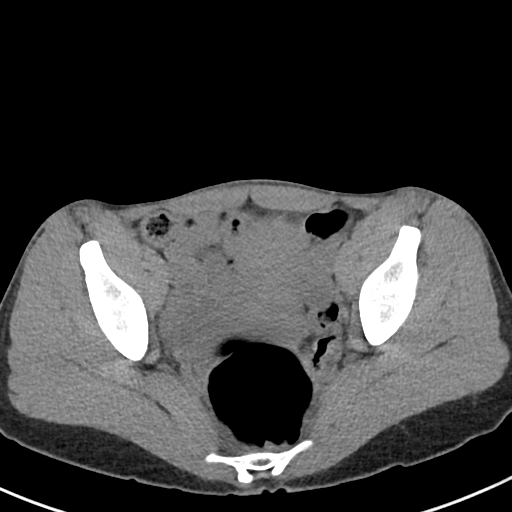
[im 31/87  soft-tissue]
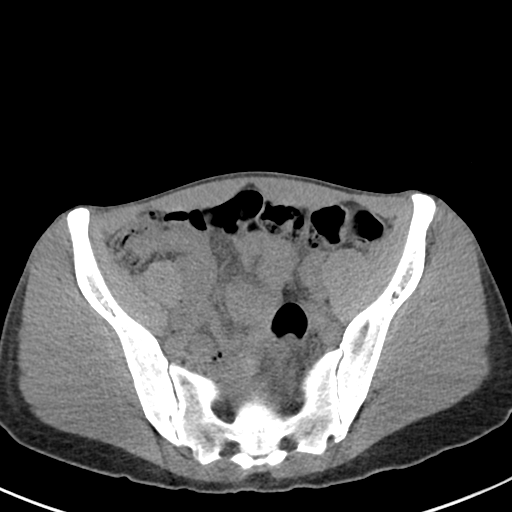
[im 38/87  soft-tissue]
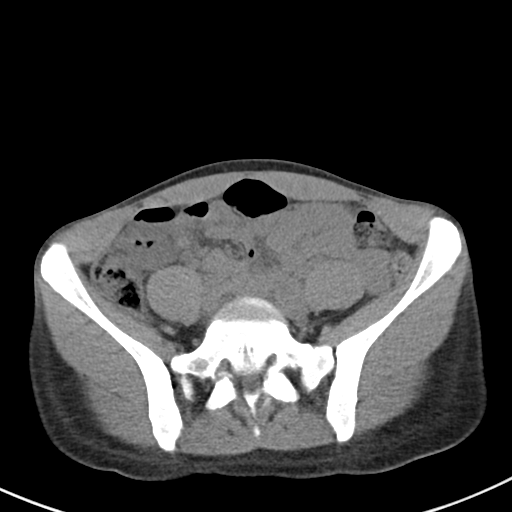
[im 45/87  soft-tissue]
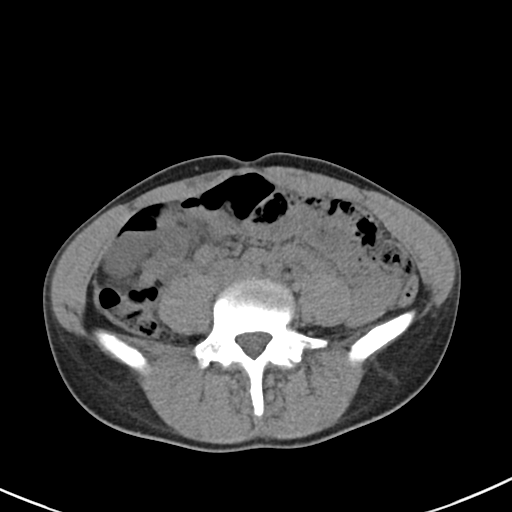
[im 49/87  soft-tissue]
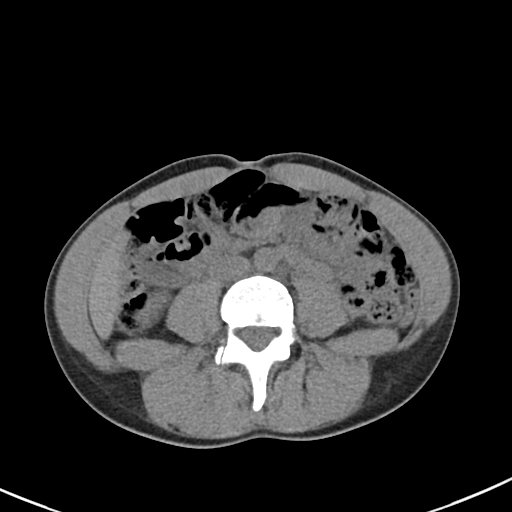
[im 56/87  soft-tissue]
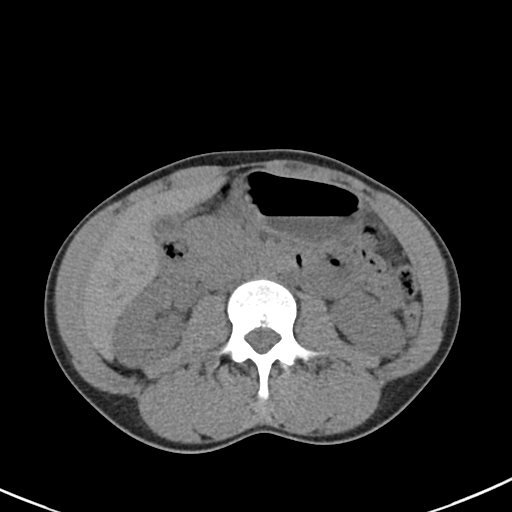
[im 56/87  bone]
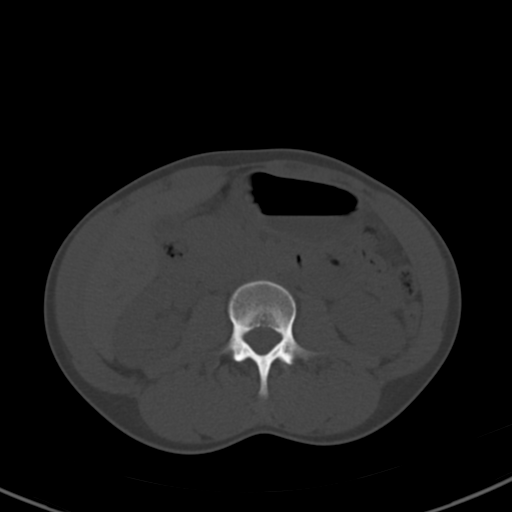
[im 62/87  soft-tissue]
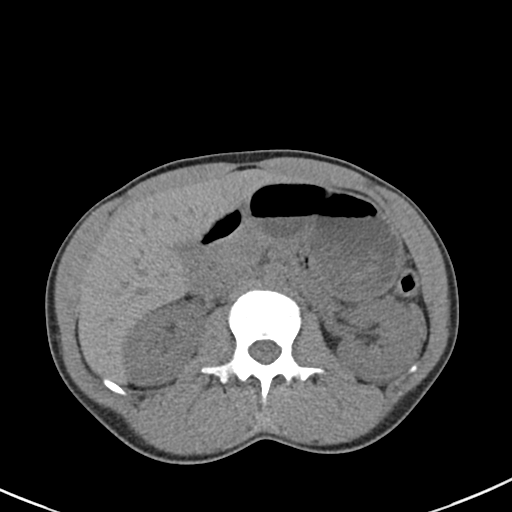
[im 69/87  soft-tissue]
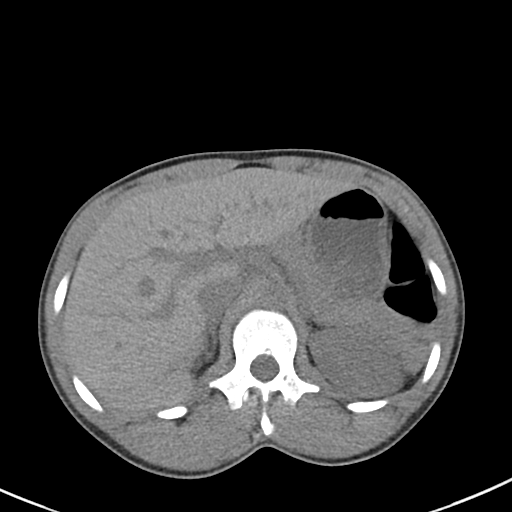
[im 76/87  soft-tissue]
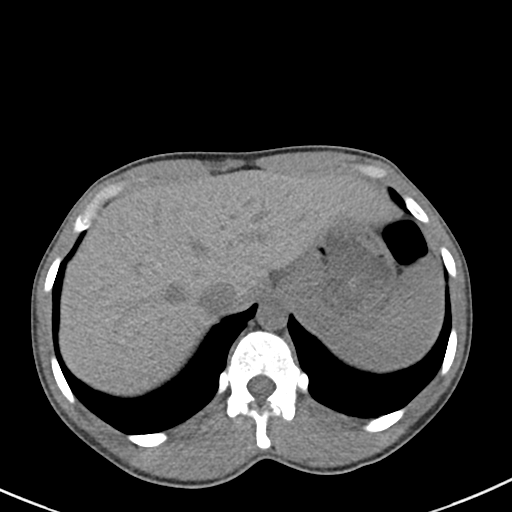
[im 83/87  soft-tissue]
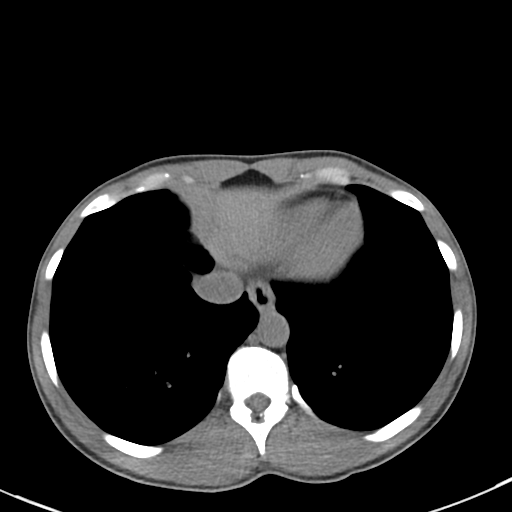

[Series 5: renal stone 3.0 cor · coronal · 0.63mm/px · 3 of 84 slices shown]
[im 28/84  soft-tissue]
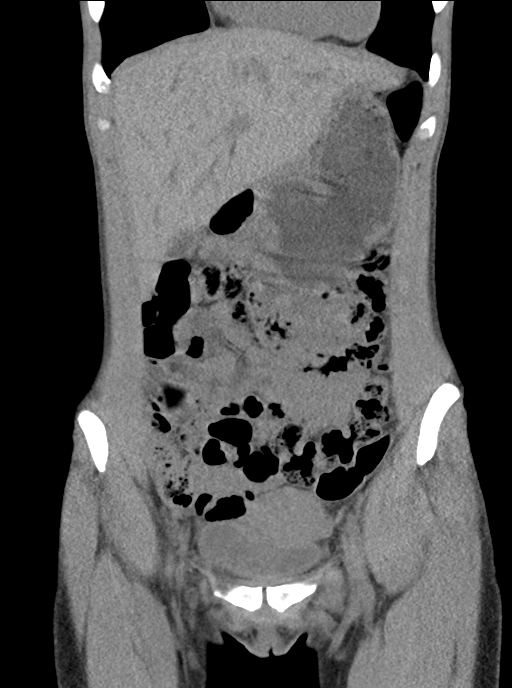
[im 37/84  soft-tissue]
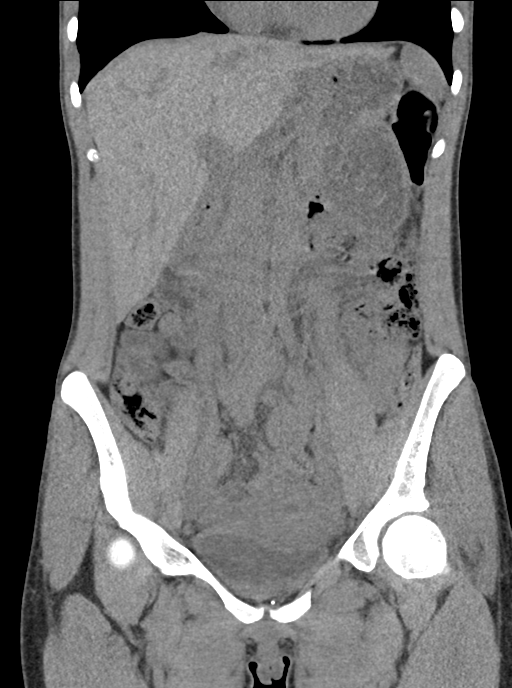
[im 47/84  soft-tissue]
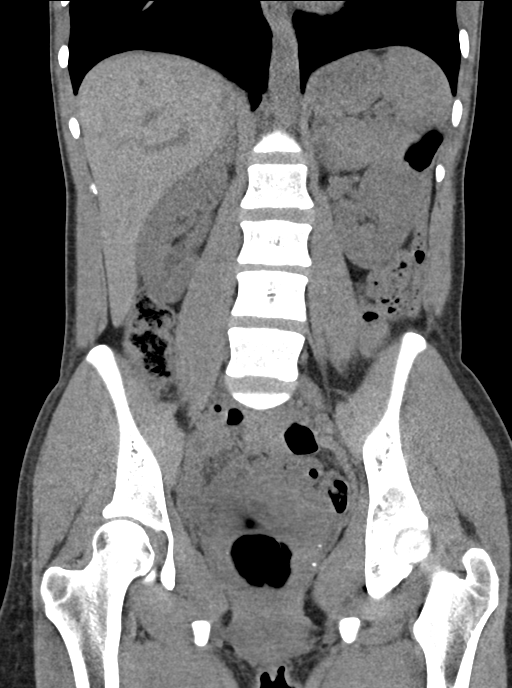

[16 of 46 positions shown; findings below may reference images not displayed]

FINDINGS: Lower chest: Visualized lung bases are clear.

Hepatobiliary: Liver demonstrates a normal unenhanced appearance.
Gallbladder contracted without acute abnormality. No biliary
dilatation.

Pancreas: Pancreas within normal limits.

Spleen: Spleen within normal limits.

Adrenals/Urinary Tract: Adrenal glands normal. Kidneys equal in size
without nephrolithiasis or hydronephrosis. No radiopaque calculi
seen along the expected course of either renal collecting system. No
hydroureter. Partially distended bladder within normal limits. No
layering stones within the bladder lumen.

Stomach/Bowel: Stomach within normal limits. No evidence for bowel
obstruction. No findings to suggest acute appendicitis. No acute
inflammatory changes seen about the bowels.

Vascular/Lymphatic: Intra-abdominal aorta of normal caliber. No
appreciable adenopathy.

Reproductive: Uterus and ovaries within normal limits.

Other: No free air.  No appreciable free fluid.

Musculoskeletal: No acute osseous abnormality. No worrisome lytic or
blastic osseous lesions.
IMPRESSION: 1. No CT evidence for nephrolithiasis or obstructive uropathy.
2. No other acute intra-abdominal or pelvic process identified.

## 2019-12-18 DIAGNOSIS — Z419 Encounter for procedure for purposes other than remedying health state, unspecified: Secondary | ICD-10-CM | POA: Diagnosis not present

## 2020-01-17 DIAGNOSIS — Z419 Encounter for procedure for purposes other than remedying health state, unspecified: Secondary | ICD-10-CM | POA: Diagnosis not present

## 2020-02-17 DIAGNOSIS — Z419 Encounter for procedure for purposes other than remedying health state, unspecified: Secondary | ICD-10-CM | POA: Diagnosis not present

## 2020-03-19 DIAGNOSIS — Z419 Encounter for procedure for purposes other than remedying health state, unspecified: Secondary | ICD-10-CM | POA: Diagnosis not present

## 2020-04-16 DIAGNOSIS — Z419 Encounter for procedure for purposes other than remedying health state, unspecified: Secondary | ICD-10-CM | POA: Diagnosis not present

## 2020-05-17 DIAGNOSIS — Z419 Encounter for procedure for purposes other than remedying health state, unspecified: Secondary | ICD-10-CM | POA: Diagnosis not present

## 2020-06-16 DIAGNOSIS — Z419 Encounter for procedure for purposes other than remedying health state, unspecified: Secondary | ICD-10-CM | POA: Diagnosis not present

## 2020-06-25 ENCOUNTER — Ambulatory Visit
Admission: EM | Admit: 2020-06-25 | Discharge: 2020-06-25 | Disposition: A | Discharge status: Home / Self Care | Payer: Medicaid Other | Attending: Emergency Medicine | Admitting: Emergency Medicine

## 2020-06-25 ENCOUNTER — Other Ambulatory Visit: Payer: Self-pay

## 2020-06-25 DIAGNOSIS — R109 Unspecified abdominal pain: Secondary | ICD-10-CM | POA: Diagnosis not present

## 2020-06-25 LAB — POCT URINALYSIS DIP (MANUAL ENTRY)
Bilirubin, UA: NEGATIVE
Blood, UA: NEGATIVE
Glucose, UA: NEGATIVE mg/dL
Ketones, POC UA: NEGATIVE mg/dL
Leukocytes, UA: NEGATIVE
Nitrite, UA: NEGATIVE
Protein Ur, POC: NEGATIVE mg/dL
Spec Grav, UA: 1.025 (ref 1.010–1.025)
Urobilinogen, UA: 0.2 E.U./dL
pH, UA: 6.5 (ref 5.0–8.0)

## 2020-06-25 LAB — POCT URINE PREGNANCY: Preg Test, Ur: NEGATIVE

## 2020-06-25 MED ORDER — TIZANIDINE HCL 2 MG PO TABS: 2.0000 mg | ORAL_TABLET | Freq: Four times a day (QID) | ORAL | 0 refills | Status: DC | PRN

## 2020-06-25 MED ORDER — NAPROXEN 500 MG PO TABS: 500.0000 mg | ORAL_TABLET | Freq: Two times a day (BID) | ORAL | 0 refills | Status: DC

## 2020-06-25 NOTE — ED Triage Notes (Signed)
Pt c/o bilateral flank pain x2 wks. Denies urinary sx's.

## 2020-06-25 NOTE — ED Provider Notes (Signed)
[facility]    CSN: [id number] Arrival date & time: 06/25/20  1922      History   Chief Complaint Chief Complaint  Patient presents with  . Flank Pain    HPI Sarah Klein is a 40 y.o. female presenting today for evaluation of flank pain.  Reports bilateral mid/lower back pain for the past 2 weeks.  Denies injury or trauma.  Denies urinary symptoms.  Reports a stinging sensation on both sides.  Reports concern for UTI as she has felt similar in the past with UTI.  She denies any dysuria, hematuria, increased frequency or urgency.  Denies any history of stones.  Denies radiation into abdomen.  Eating and drinking normally.  Bowels normal.  Denies taking any medicines for symptoms.  HPI  Past Medical History:  Diagnosis Date  . Headache(784.0)     There are no problems to display for this patient.   Past Surgical History:  Procedure Laterality Date  . MOUTH SURGERY      OB History    Gravida  4   Para  3   Term  2   Preterm  1   AB      Living  3     SAB      IAB      Ectopic      Multiple      Live Births  3            Home Medications    Prior to Admission medications   Medication Sig Start Date End Date Taking? Authorizing Provider  naproxen (NAPROSYN) 500 MG tablet Take 1 tablet (500 mg total) by mouth 2 (two) times daily. 06/25/20  Yes Taliya Mcclard C, PA-C  tiZANidine (ZANAFLEX) 2 MG tablet Take 1-2 tablets (2-4 mg total) by mouth every 6 (six) hours as needed for muscle spasms. 06/25/20  Yes Eleftherios Dudenhoeffer, Junius Creamer, PA-C    Family History Family History  Problem Relation Age of Onset  . Diabetes Maternal Grandmother   . Migraines Mother     Social History Social History   Tobacco Use  . Smoking status: Current Every Day Smoker    Packs/day: 0.75    Types: Cigarettes    Last attempt to quit: 11/14/2012    Years since quitting: 7.6  . Smokeless tobacco: Never Used  Vaping Use  . Vaping Use: Never used  Substance  Use Topics  . Alcohol use: Not Currently    Comment: weekends  . Drug use: No     Allergies   Patient has no known allergies.   Review of Systems Review of Systems  Constitutional: Negative for fatigue and fever.  Eyes: Negative for visual disturbance.  Respiratory: Negative for shortness of breath.   Cardiovascular: Negative for chest pain.  Gastrointestinal: Negative for abdominal pain, nausea and vomiting.  Musculoskeletal: Positive for back pain and myalgias. Negative for arthralgias and joint swelling.  Skin: Negative for color change, rash and wound.  Neurological: Negative for dizziness, weakness, light-headedness and headaches.     Physical Exam Triage Vital Signs ED Triage Vitals  Enc Vitals Group     BP 06/25/20 1945 (!) 108/50     Pulse Rate 06/25/20 1945 73     Resp 06/25/20 1945 18     Temp 06/25/20 1945 98.2 F (36.8 C)     Temp Source 06/25/20 1945 Oral     SpO2 06/25/20 1945 98 %     Weight --  Height --      Head Circumference --      Peak Flow --      Pain Score 06/25/20 1946 7     Pain Loc --      Pain Edu? --      Excl. in GC? --    No data found.  Updated Vital Signs BP (!) 108/50 (BP Location: Left Arm)   Pulse 73   Temp 98.2 F (36.8 C) (Oral)   Resp 18   LMP 06/09/2020   SpO2 98%   Visual Acuity Right Eye Distance:   Left Eye Distance:   Bilateral Distance:    Right Eye Near:   Left Eye Near:    Bilateral Near:     Physical Exam Vitals and nursing note reviewed.  Constitutional:      Appearance: She is well-developed.     Comments: No acute distress  HENT:     Head: Normocephalic and atraumatic.     Nose: Nose normal.  Eyes:     Conjunctiva/sclera: Conjunctivae normal.  Cardiovascular:     Rate and Rhythm: Normal rate.  Pulmonary:     Effort: Pulmonary effort is normal. No respiratory distress.     Comments: Breathing comfortably at rest, CTABL, no wheezing, rales or other adventitious sounds  auscultated Abdominal:     General: There is no distension.  Musculoskeletal:        General: Normal range of motion.     Cervical back: Neck supple.     Comments: Bilateral lower thoracic area/upper lumbar area with tenderness to palpation, no midline tenderness palpable deformity or step-off  Skin:    General: Skin is warm and dry.  Neurological:     Mental Status: She is alert and oriented to person, place, and time.      UC Treatments / Results  Labs (all labs ordered are listed, but only abnormal results are displayed) Labs Reviewed  URINE CULTURE  BASIC METABOLIC PANEL  POCT URINALYSIS DIP (MANUAL ENTRY)  POCT URINE PREGNANCY    EKG   Radiology No results found.  Procedures Procedures (including critical care time)  Medications Ordered in UC Medications - No data to display  Initial Impression / Assessment and Plan / UC Course  I have reviewed the triage vital signs and the nursing notes.  Pertinent labs & imaging results that were available during my care of the patient were reviewed by me and considered in my medical decision making (see chart for details).     UA unremarkable, no hemoglobin or signs of infection, will proceed with checking BMP to ensure kidney function stable, recommending anti-inflammatories and muscle relaxers and treatment for MSK etiology in the interim.  Urine culture to definitively rule out UTI.  Deferring any antibiotic therapy at this time.  Discussed strict return precautions. Patient verbalized understanding and is agreeable with plan.  Final Clinical Impressions(s) / UC Diagnoses   Final diagnoses:  Bilateral flank pain     Discharge Instructions     Urine normal today Urine culture and blood work pending to check kidney function Drink plenty of fluids Naprosyn twice daily for pain with food Tizanidine at home/bedtime-this is a muscle relaxer, may cause drowsiness  Please follow-up if any symptoms not improving or  worsening     ED Prescriptions    Medication Sig Dispense Auth. Provider   naproxen (NAPROSYN) 500 MG tablet Take 1 tablet (500 mg total) by mouth 2 (two) times daily. 30 tablet Lanecia Sliva, [place]  C, PA-C   tiZANidine (ZANAFLEX) 2 MG tablet Take 1-2 tablets (2-4 mg total) by mouth every 6 (six) hours as needed for muscle spasms. 30 tablet Rishav Rockefeller, [place] C, PA-C     PDMP not reviewed this encounter.   Lew Dawes, [place] 06/25/20 2106

## 2020-06-25 NOTE — Discharge Instructions (Addendum)
Urine normal today Urine culture and blood work pending to check kidney function Drink plenty of fluids Naprosyn twice daily for pain with food Tizanidine at home/bedtime-this is a muscle relaxer, may cause drowsiness  Please follow-up if any symptoms not improving or worsening

## 2020-06-27 LAB — URINE CULTURE

## 2020-06-27 LAB — BASIC METABOLIC PANEL
BUN/Creatinine Ratio: 13 (ref 9–23)
BUN: 11 mg/dL (ref 6–20)
CO2: 21 mmol/L (ref 20–29)
Calcium: 9 mg/dL (ref 8.7–10.2)
Chloride: 103 mmol/L (ref 96–106)
Creatinine, Ser: 0.88 mg/dL (ref 0.57–1.00)
Glucose: 82 mg/dL (ref 65–99)
Potassium: 4.2 mmol/L (ref 3.5–5.2)
Sodium: 136 mmol/L (ref 134–144)
eGFR: 86 mL/min/{1.73_m2} (ref >59)

## 2020-07-17 DIAGNOSIS — Z419 Encounter for procedure for purposes other than remedying health state, unspecified: Secondary | ICD-10-CM | POA: Diagnosis not present

## 2020-08-16 DIAGNOSIS — Z419 Encounter for procedure for purposes other than remedying health state, unspecified: Secondary | ICD-10-CM | POA: Diagnosis not present

## 2020-09-16 DIAGNOSIS — Z419 Encounter for procedure for purposes other than remedying health state, unspecified: Secondary | ICD-10-CM | POA: Diagnosis not present

## 2020-10-17 DIAGNOSIS — Z419 Encounter for procedure for purposes other than remedying health state, unspecified: Secondary | ICD-10-CM | POA: Diagnosis not present

## 2020-11-16 DIAGNOSIS — Z419 Encounter for procedure for purposes other than remedying health state, unspecified: Secondary | ICD-10-CM | POA: Diagnosis not present

## 2020-12-17 DIAGNOSIS — Z419 Encounter for procedure for purposes other than remedying health state, unspecified: Secondary | ICD-10-CM | POA: Diagnosis not present

## 2021-01-16 DIAGNOSIS — Z419 Encounter for procedure for purposes other than remedying health state, unspecified: Secondary | ICD-10-CM | POA: Diagnosis not present

## 2021-02-16 DIAGNOSIS — Z419 Encounter for procedure for purposes other than remedying health state, unspecified: Secondary | ICD-10-CM | POA: Diagnosis not present

## 2021-03-05 ENCOUNTER — Telehealth: Payer: Medicaid Other | Admitting: Family

## 2021-03-05 DIAGNOSIS — M545 Low back pain, unspecified: Secondary | ICD-10-CM | POA: Diagnosis not present

## 2021-03-05 MED ORDER — NAPROXEN 500 MG PO TABS: 500.0000 mg | ORAL_TABLET | Freq: Two times a day (BID) | ORAL | 0 refills | Status: DC

## 2021-03-05 MED ORDER — BACLOFEN 10 MG PO TABS: 10.0000 mg | ORAL_TABLET | Freq: Three times a day (TID) | ORAL | 0 refills | Status: DC

## 2021-03-05 NOTE — Progress Notes (Signed)
Virtual Visit Consent   Sarah Klein, you are scheduled for a virtual visit with a [facility] provider today.     Just as with appointments in the office, your consent must be obtained to participate.  Your consent will be active for this visit and any virtual visit you may have with one of our providers in the next 365 days.     If you have a MyChart account, a copy of this consent can be sent to you electronically.  All virtual visits are billed to your insurance company just like a traditional visit in the office.    As this is a virtual visit, video technology does not allow for your provider to perform a traditional examination.  This may limit your provider's ability to fully assess your condition.  If your provider identifies any concerns that need to be evaluated in person or the need to arrange testing (such as labs, EKG, etc.), we will make arrangements to do so.     Although advances in technology are sophisticated, we cannot ensure that it will always work on either your end or our end.  If the connection with a video visit is poor, the visit may have to be switched to a telephone visit.  With either a video or telephone visit, we are not always able to ensure that we have a secure connection.     I need to obtain your verbal consent now.   Are you willing to proceed with your visit today?    Sarah Klein has provided verbal consent on 03/05/2021 for a virtual visit (video or telephone).   [name], FNP   Date: 03/05/2021 12:05 PM   Virtual Visit via Video Note   I, [name], connected with  Sarah Klein  ([id number], 02-02-1981) on 03/05/21 at 12:00 PM EST by a video-enabled telemedicine application and verified that I am speaking with the correct person using two identifiers.  Location: Patient: Virtual Visit Location Patient: Home Provider: Virtual Visit Location Provider: Home Office   I discussed the limitations of evaluation and management by  telemedicine and the availability of in person appointments. The patient expressed understanding and agreed to proceed.    History of Present Illness: Sarah Klein is a 41 y.o. who identifies as a female who was assigned female at birth, and is being seen today for lower back pain that started last week after moving. Denies any fever, dysuria, or UTI symptoms.   HPI: Back Pain This is a new problem. The current episode started in the past 7 days. The pain is present in the lumbar spine. The quality of the pain is described as aching. The pain is at a severity of 8/10. The pain is mild. The symptoms are aggravated by lying down. Pertinent negatives include no chest pain, dysuria, fever, paresthesias or tingling. She has tried nothing for the symptoms. The treatment provided no relief.   Problems: There are no problems to display for this patient.   Allergies: No Known Allergies Medications:  Current Outpatient Medications:    baclofen (LIORESAL) 10 MG tablet, Take 1 tablet (10 mg total) by mouth 3 (three) times daily., Disp: 30 each, Rfl: 0   naproxen (NAPROSYN) 500 MG tablet, Take 1 tablet (500 mg total) by mouth 2 (two) times daily with a meal., Disp: 30 tablet, Rfl: 0  Current Facility-Administered Medications:    medroxyPROGESTERone (DEPO-PROVERA) injection 150 mg, 150 mg, Intramuscular, Q90 days, Donnamae Jude, MD, 150  mg at 06/16/13 1051  Observations/Objective: Patient is well-developed, well-nourished in no acute distress.  Resting comfortably  at home.  Head is normocephalic, atraumatic.  No labored breathing.  Speech is clear and coherent with logical content.  Patient is alert and oriented at baseline.  Pain in lower back with flexion  Assessment and Plan: 1. Acute low back pain without sciatica, unspecified back pain laterality  Start naprosyn and baclofen  No other NSAIDs while taking naprosyn  Sedation precautions discussed  Follow up if symptoms worsen or do not  improve    Follow Up Instructions: I discussed the assessment and treatment plan with the patient. The patient was provided an opportunity to ask questions and all were answered. The patient agreed with the plan and demonstrated an understanding of the instructions.  A copy of instructions were sent to the patient via MyChart unless otherwise noted below.    The patient was advised to call back or seek an in-person evaluation if the symptoms worsen or if the condition fails to improve as anticipated.  Time:  I spent 6 minutes with the patient via telehealth technology discussing the above problems/concerns.    [name], FNP

## 2021-03-05 NOTE — Progress Notes (Signed)

## 2021-03-05 NOTE — Progress Notes (Signed)
Attempted to connect with patient multiple times. Will close visit.  ° °Robbye Dede, FNP ° °

## 2021-03-05 NOTE — Patient Instructions (Signed)

## 2021-03-19 DIAGNOSIS — Z419 Encounter for procedure for purposes other than remedying health state, unspecified: Secondary | ICD-10-CM | POA: Diagnosis not present

## 2021-04-16 DIAGNOSIS — Z419 Encounter for procedure for purposes other than remedying health state, unspecified: Secondary | ICD-10-CM | POA: Diagnosis not present

## 2021-05-17 DIAGNOSIS — Z419 Encounter for procedure for purposes other than remedying health state, unspecified: Secondary | ICD-10-CM | POA: Diagnosis not present

## 2021-06-16 DIAGNOSIS — Z419 Encounter for procedure for purposes other than remedying health state, unspecified: Secondary | ICD-10-CM | POA: Diagnosis not present

## 2021-07-17 DIAGNOSIS — Z419 Encounter for procedure for purposes other than remedying health state, unspecified: Secondary | ICD-10-CM | POA: Diagnosis not present

## 2021-08-16 DIAGNOSIS — Z419 Encounter for procedure for purposes other than remedying health state, unspecified: Secondary | ICD-10-CM | POA: Diagnosis not present

## 2021-09-16 DIAGNOSIS — Z419 Encounter for procedure for purposes other than remedying health state, unspecified: Secondary | ICD-10-CM | POA: Diagnosis not present

## 2021-10-17 DIAGNOSIS — Z419 Encounter for procedure for purposes other than remedying health state, unspecified: Secondary | ICD-10-CM | POA: Diagnosis not present

## 2021-11-16 DIAGNOSIS — Z419 Encounter for procedure for purposes other than remedying health state, unspecified: Secondary | ICD-10-CM | POA: Diagnosis not present

## 2021-12-17 DIAGNOSIS — Z419 Encounter for procedure for purposes other than remedying health state, unspecified: Secondary | ICD-10-CM | POA: Diagnosis not present

## 2022-01-16 DIAGNOSIS — Z419 Encounter for procedure for purposes other than remedying health state, unspecified: Secondary | ICD-10-CM | POA: Diagnosis not present

## 2022-02-16 DIAGNOSIS — Z419 Encounter for procedure for purposes other than remedying health state, unspecified: Secondary | ICD-10-CM | POA: Diagnosis not present

## 2022-03-19 DIAGNOSIS — Z419 Encounter for procedure for purposes other than remedying health state, unspecified: Secondary | ICD-10-CM | POA: Diagnosis not present

## 2022-04-17 DIAGNOSIS — Z419 Encounter for procedure for purposes other than remedying health state, unspecified: Secondary | ICD-10-CM | POA: Diagnosis not present

## 2022-05-09 ENCOUNTER — Telehealth: Payer: Medicaid Other | Admitting: Physician Assistant

## 2022-05-09 DIAGNOSIS — M542 Cervicalgia: Secondary | ICD-10-CM

## 2022-05-09 DIAGNOSIS — R052 Subacute cough: Secondary | ICD-10-CM

## 2022-05-09 MED ORDER — BACLOFEN 10 MG PO TABS: 10.0000 mg | ORAL_TABLET | Freq: Three times a day (TID) | ORAL | 0 refills | Status: DC

## 2022-05-09 MED ORDER — AZITHROMYCIN 250 MG PO TABS: ORAL_TABLET | ORAL | 0 refills | Status: AC

## 2022-05-09 MED ORDER — NAPROXEN 500 MG PO TABS: 500.0000 mg | ORAL_TABLET | Freq: Two times a day (BID) | ORAL | 0 refills | Status: DC

## 2022-05-09 NOTE — Progress Notes (Signed)
Virtual Visit Consent   Sarah Klein, you are scheduled for a virtual visit with a [facility] provider today. Just as with appointments in the office, your consent must be obtained to participate. Your consent will be active for this visit and any virtual visit you may have with one of our providers in the next 365 days. If you have a MyChart account, a copy of this consent can be sent to you electronically.  As this is a virtual visit, video technology does not allow for your provider to perform a traditional examination. This may limit your provider's ability to fully assess your condition. If your provider identifies any concerns that need to be evaluated in person or the need to arrange testing (such as labs, EKG, etc.), we will make arrangements to do so. Although advances in technology are sophisticated, we cannot ensure that it will always work on either your end or our end. If the connection with a video visit is poor, the visit may have to be switched to a telephone visit. With either a video or telephone visit, we are not always able to ensure that we have a secure connection.  By engaging in this virtual visit, you consent to the provision of healthcare and authorize for your insurance to be billed (if applicable) for the services provided during this visit. Depending on your insurance coverage, you may receive a charge related to this service.  I need to obtain your verbal consent now. Are you willing to proceed with your visit today? Sarah Klein has provided verbal consent on 05/09/2022 for a virtual visit (video or telephone). Inda Coke, [place]  Date: 05/09/2022 12:13 PM  Virtual Visit via Video Note   I, Inda Coke, connected with  Sarah Klein  ([id number], [birth year]) on 05/09/22 at 12:00 PM EDT by a video-enabled telemedicine application and verified that I am speaking with the correct person using two identifiers.  Location: Patient: Virtual Visit Location  Patient: Home Provider: Virtual Visit Location Provider: Home Office   I discussed the limitations of evaluation and management by telemedicine and the availability of in person appointments. The patient expressed understanding and agreed to proceed.    History of Present Illness: Sarah Klein is a 42 y.o. who identifies as a female who was assigned female at birth, and is being seen today for cough and neck pain.  R-sided neck pain Started last night Has had this before Lifting/moving heavy items around garage yesterday Denies numbness to hand or weakness Has taken tylenol but doesn't completely relieve the pain   Cough Has had a few weeks Has not taking anything for the cough No fever, chills, SOB, chest pain, n/v/d, sick contacts, concern for pregnancy, hemoptysis Appetite is fair Coughing up phlegm Current smoker   HPI: HPI  Problems: There are no problems to display for this patient.   Allergies: No Known Allergies Medications:  Current Outpatient Medications:    azithromycin (ZITHROMAX) 250 MG tablet, Take 2 tablets on day 1, then 1 tablet daily on days 2 through 5, Disp: 6 tablet, Rfl: 0   baclofen (LIORESAL) 10 MG tablet, Take 1 tablet (10 mg total) by mouth 3 (three) times daily., Disp: 30 each, Rfl: 0   naproxen (NAPROSYN) 500 MG tablet, Take 1 tablet (500 mg total) by mouth 2 (two) times daily with a meal., Disp: 30 tablet, Rfl: 0  Current Facility-Administered Medications:    medroxyPROGESTERone (DEPO-PROVERA) injection 150 mg, 150 mg, Intramuscular, Q90 days, Kennon Rounds,  Standley Dakins, MD, 150 mg at 06/16/13 1051  Observations/Objective: Patient is well-developed, well-nourished in no acute distress.  Resting comfortably  at home.  Head is normocephalic, atraumatic.  No labored breathing.  Speech is clear and coherent with logical content.  Patient is alert and oriented at baseline.    Assessment and Plan: 1. Neck pain on right side No red flags Suspect trap  strain Trial naproxen and baclofen; sleepy precautions advised  2. Subacute cough No red flags on exam.  Will initiate azithromycin per orders. Discussed taking medications as prescribed. Reviewed return precautions including worsening fever, SOB, worsening cough or other concerns. Push fluids and rest. I recommend that patient follow-up if symptoms worsen or persist despite treatment x 7-10 days, sooner if needed.   Follow Up Instructions: I discussed the assessment and treatment plan with the patient. The patient was provided an opportunity to ask questions and all were answered. The patient agreed with the plan and demonstrated an understanding of the instructions.  A copy of instructions were sent to the patient via MyChart unless otherwise noted below.   The patient was advised to call back or seek an in-person evaluation if the symptoms worsen or if the condition fails to improve as anticipated.  Time:  I spent 5-10 minutes with the patient via telehealth technology discussing the above problems/concerns.    Inda Coke, [place]

## 2022-05-18 DIAGNOSIS — Z419 Encounter for procedure for purposes other than remedying health state, unspecified: Secondary | ICD-10-CM | POA: Diagnosis not present

## 2022-06-17 DIAGNOSIS — Z419 Encounter for procedure for purposes other than remedying health state, unspecified: Secondary | ICD-10-CM | POA: Diagnosis not present

## 2022-07-18 DIAGNOSIS — Z419 Encounter for procedure for purposes other than remedying health state, unspecified: Secondary | ICD-10-CM | POA: Diagnosis not present

## 2022-08-17 DIAGNOSIS — Z419 Encounter for procedure for purposes other than remedying health state, unspecified: Secondary | ICD-10-CM | POA: Diagnosis not present

## 2022-08-27 ENCOUNTER — Telehealth: Payer: Self-pay

## 2022-08-27 NOTE — Telephone Encounter (Signed)
Chart review completed for patient. Patient is due for cervical cancer screen. Mychart message sent to patient to inquire about scheduling.  Giuseppe Duchemin, Population Health Specialist.   

## 2022-09-17 DIAGNOSIS — Z419 Encounter for procedure for purposes other than remedying health state, unspecified: Secondary | ICD-10-CM | POA: Diagnosis not present

## 2022-09-24 ENCOUNTER — Emergency Department (HOSPITAL_COMMUNITY): Payer: Medicaid Other

## 2022-09-24 ENCOUNTER — Emergency Department (HOSPITAL_COMMUNITY)
Admission: EM | Admit: 2022-09-24 | Discharge: 2022-09-24 | Disposition: A | Discharge status: Home / Self Care | Payer: Medicaid Other | Attending: Emergency Medicine | Admitting: Emergency Medicine

## 2022-09-24 ENCOUNTER — Other Ambulatory Visit: Payer: Self-pay

## 2022-09-24 ENCOUNTER — Encounter (HOSPITAL_COMMUNITY): Payer: Self-pay

## 2022-09-24 DIAGNOSIS — W01198A Fall on same level from slipping, tripping and stumbling with subsequent striking against other object, initial encounter: Secondary | ICD-10-CM | POA: Diagnosis not present

## 2022-09-24 DIAGNOSIS — S199XXA Unspecified injury of neck, initial encounter: Secondary | ICD-10-CM | POA: Diagnosis not present

## 2022-09-24 DIAGNOSIS — E860 Dehydration: Secondary | ICD-10-CM | POA: Insufficient documentation

## 2022-09-24 DIAGNOSIS — R55 Syncope and collapse: Secondary | ICD-10-CM | POA: Insufficient documentation

## 2022-09-24 DIAGNOSIS — Y92513 Shop (commercial) as the place of occurrence of the external cause: Secondary | ICD-10-CM | POA: Insufficient documentation

## 2022-09-24 DIAGNOSIS — S161XXA Strain of muscle, fascia and tendon at neck level, initial encounter: Secondary | ICD-10-CM | POA: Diagnosis not present

## 2022-09-24 DIAGNOSIS — M47812 Spondylosis without myelopathy or radiculopathy, cervical region: Secondary | ICD-10-CM | POA: Diagnosis not present

## 2022-09-24 DIAGNOSIS — S0990XA Unspecified injury of head, initial encounter: Secondary | ICD-10-CM | POA: Insufficient documentation

## 2022-09-24 LAB — BASIC METABOLIC PANEL
Anion gap: 7 (ref 5–15)
BUN: 16 mg/dL (ref 6–20)
CO2: 26 mmol/L (ref 22–32)
Calcium: 8.8 mg/dL — ABNORMAL LOW (ref 8.9–10.3)
Chloride: 102 mmol/L (ref 98–111)
Creatinine, Ser: 1.02 mg/dL — ABNORMAL HIGH (ref 0.44–1.00)
GFR, Estimated: >60 mL/min (ref >60)
Glucose, Bld: 95 mg/dL (ref 70–99)
Potassium: 3.6 mmol/L (ref 3.5–5.1)
Sodium: 135 mmol/L (ref 135–145)

## 2022-09-24 LAB — CBC WITH DIFFERENTIAL/PLATELET
Abs Immature Granulocytes: 0.02 10*3/uL (ref 0.00–0.07)
Basophils Absolute: 0.1 10*3/uL (ref 0.0–0.1)
Basophils Relative: 1 %
Eosinophils Absolute: 0.4 10*3/uL (ref 0.0–0.5)
Eosinophils Relative: 5 %
HCT: 32.8 % — ABNORMAL LOW (ref 36.0–46.0)
Hemoglobin: 10.3 g/dL — ABNORMAL LOW (ref 12.0–15.0)
Immature Granulocytes: 0 %
Lymphocytes Relative: 24 %
Lymphs Abs: 1.8 10*3/uL (ref 0.7–4.0)
MCH: 27.1 pg (ref 26.0–34.0)
MCHC: 31.4 g/dL (ref 30.0–36.0)
MCV: 86.3 fL (ref 80.0–100.0)
Monocytes Absolute: 0.5 10*3/uL (ref 0.1–1.0)
Monocytes Relative: 7 %
Neutro Abs: 4.5 10*3/uL (ref 1.7–7.7)
Neutrophils Relative %: 63 %
Platelets: 439 10*3/uL — ABNORMAL HIGH (ref 150–400)
RBC: 3.8 MIL/uL — ABNORMAL LOW (ref 3.87–5.11)
RDW: 15.7 % — ABNORMAL HIGH (ref 11.5–15.5)
WBC: 7.3 10*3/uL (ref 4.0–10.5)
nRBC: 0 % (ref 0.0–0.2)

## 2022-09-24 LAB — HCG, SERUM, QUALITATIVE: Preg, Serum: NEGATIVE

## 2022-09-24 LAB — TROPONIN I (HIGH SENSITIVITY): Troponin I (High Sensitivity): 3 ng/L (ref <18)

## 2022-09-24 LAB — CBG MONITORING, ED: Glucose-Capillary: 102 mg/dL — ABNORMAL HIGH (ref 70–99)

## 2022-09-24 MED ORDER — ONDANSETRON HCL 4 MG/2ML IJ SOLN
4.0000 mg | Freq: Once | INTRAMUSCULAR | Status: AC
  Administered 2022-09-24: 4 mg via INTRAVENOUS
  Filled 2022-09-24: qty 2

## 2022-09-24 MED ORDER — OXYCODONE HCL 5 MG PO TABS
5.0000 mg | ORAL_TABLET | Freq: Once | ORAL | Status: AC
  Administered 2022-09-24: 5 mg via ORAL
  Filled 2022-09-24: qty 1

## 2022-09-24 MED ORDER — LACTATED RINGERS IV BOLUS
1000.0000 mL | Freq: Once | INTRAVENOUS | Status: AC
  Administered 2022-09-24: 1000 mL via INTRAVENOUS

## 2022-09-24 MED ORDER — CYCLOBENZAPRINE HCL 10 MG PO TABS
10.0000 mg | ORAL_TABLET | Freq: Three times a day (TID) | ORAL | 0 refills | Status: AC | PRN
Start: 2022-09-24 — End: 2022-09-27

## 2022-09-24 NOTE — Discharge Instructions (Addendum)
Thank you for letting us take care of you today.  Overall, your workup was reassuring. We did not see any significant lab abnormalities.  The CT scan of your head was normal.  The CT scan of your neck did show some degenerative changes that we discussed but no findings relating to your episode of passing out.  We gave you fluids to help with hydration.  Please be sure to stay well-hydrated and eat regular meals and avoid excessive heat exposure to prevent herself from passing out.  Follow-up with primary care to discuss any continued symptoms or concerns.  I am prescribing a muscle relaxer to help with your neck pain from your fall.  For new or worsening symptoms including chest pain, shortness of breath, severe headache or vision changes, recurrent episodes of passing out, or other new, concerning symptoms, return to the nearest ED for reevaluation.

## 2022-09-24 NOTE — ED Provider Notes (Signed)
EMERGENCY DEPARTMENT AT [facility] Provider Note   CSN: [id number] Arrival date & time: 09/24/22  2044     History  Chief Complaint  Patient presents with   Syncopal Episode    Sarah Klein is a 42 y.o. female who presents to the ED complaining of a syncopal episode yesterday.  She states that she was very busy running errands and did not eat or drink very much when she was traveling in the car and started to feel lightheaded.  She was then out shopping when she had a syncopal episode and fell back hitting the back of her head on concrete.  States that before the episode she felt nauseated and lightheaded.  Denies chest pain or shortness of breath.  Has had intermittent episodes of dizziness since the syncopal episode as well as headaches.  No vision changes.  No focal weakness.  No episodes of vomiting since her head injury.  Does not take anticoagulants.  No history of recurrent syncope.      Home Medications Prior to Admission medications   Medication Sig Start Date End Date Taking? Authorizing Provider  cyclobenzaprine (FLEXERIL) 10 MG tablet Take 1 tablet (10 mg total) by mouth 3 (three) times daily as needed for up to 3 days for muscle spasms. 09/24/22 09/27/22 Yes Marene Gilliam L, PA-C  baclofen (LIORESAL) 10 MG tablet Take 1 tablet (10 mg total) by mouth 3 (three) times daily. 05/09/22   Jarold Motto, PA  naproxen (NAPROSYN) 500 MG tablet Take 1 tablet (500 mg total) by mouth 2 (two) times daily with a meal. 05/09/22   Jarold Motto, PA      Allergies    Patient has no known allergies.    Review of Systems   Review of Systems  All other systems reviewed and are negative.   Physical Exam Updated Vital Signs BP 112/75 (BP Location: Right Arm)   Pulse 78   Temp 98.1 F (36.7 C) (Oral)   Resp 18   Ht 5\' 6"  (1.676 m)   Wt 52.2 kg   LMP  (LMP Unknown)   SpO2 99%   BMI 18.56 kg/m  Physical Exam Vitals and nursing note reviewed.   Constitutional:      General: She is not in acute distress.    Appearance: Normal appearance. She is not ill-appearing, toxic-appearing or diaphoretic.  HENT:     Head: Normocephalic and atraumatic.     Mouth/Throat:     Mouth: Mucous membranes are dry.  Eyes:     General: No scleral icterus.    Extraocular Movements: Extraocular movements intact.     Conjunctiva/sclera: Conjunctivae normal.     Pupils: Pupils are equal, round, and reactive to light.  Neck:     Comments: No midline tenderness, step-offs, or deformities, tenderness over the right trapezius muscle with spasming Cardiovascular:     Rate and Rhythm: Normal rate and regular rhythm.     Heart sounds: No murmur heard. Pulmonary:     Effort: Pulmonary effort is normal.     Breath sounds: Normal breath sounds.  Abdominal:     General: Abdomen is flat. There is no distension.     Palpations: Abdomen is soft.     Tenderness: There is no abdominal tenderness. There is no guarding or rebound.  Musculoskeletal:        General: Normal range of motion.     Cervical back: Normal range of motion and neck supple. No rigidity.  Right lower leg: No edema.     Left lower leg: No edema.     Comments: No midline CTL spinal tenderness, step-offs, or deformities  Skin:    General: Skin is warm and dry.     Capillary Refill: Capillary refill takes less than 2 seconds.  Neurological:     General: No focal deficit present.     Mental Status: She is alert and oriented to person, place, and time.     GCS: GCS eye subscore is 4. GCS verbal subscore is 5. GCS motor subscore is 6.     Cranial Nerves: Cranial nerves 2-12 are intact.  Psychiatric:        Speech: Speech normal.        Behavior: Behavior is cooperative.     ED Results / Procedures / Treatments   Labs (all labs ordered are listed, but only abnormal results are displayed) Labs Reviewed  BASIC METABOLIC PANEL - Abnormal; Notable for the following components:      Result  Value   Creatinine, Ser 1.02 (*)    Calcium 8.8 (*)    All other components within normal limits  CBC WITH DIFFERENTIAL/PLATELET - Abnormal; Notable for the following components:   RBC 3.80 (*)    Hemoglobin 10.3 (*)    HCT 32.8 (*)    RDW 15.7 (*)    Platelets 439 (*)    All other components within normal limits  CBG MONITORING, ED - Abnormal; Notable for the following components:   Glucose-Capillary 102 (*)    All other components within normal limits  HCG, SERUM, QUALITATIVE  TROPONIN I (HIGH SENSITIVITY)    EKG None  Radiology CT Cervical Spine Wo Contrast  Result Date: 09/24/2022 CLINICAL DATA:  Neck trauma, dangerous injury mechanism (Age 6-64y) Syncope leading to fall. EXAM: CT CERVICAL SPINE WITHOUT CONTRAST TECHNIQUE: Multidetector CT imaging of the cervical spine was performed without intravenous contrast. Multiplanar CT image reconstructions were also generated. RADIATION DOSE REDUCTION: This exam was performed according to the departmental dose-optimization program which includes automated exposure control, adjustment of the mA and/or kV according to patient size and/or use of iterative reconstruction technique. COMPARISON:  None Available. FINDINGS: Alignment: Straightening of normal lordosis. No traumatic subluxation. Skull base and vertebrae: No acute fracture. Vertebral body heights are maintained. The dens and skull base are intact. Non fusion posterior arch of C1, variant anatomy. Soft tissues and spinal canal: No prevertebral fluid or swelling. No visible canal hematoma. Disc levels: Anterior spurring C5-C6 and C6-C7. C7-C1 facet hypertrophy on the left. Upper chest: No acute findings. Other: None. 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 Electronically Signed   By: Narda Rutherford M.D.   On: 09/24/2022 22:09   CT Head Wo Contrast  Result Date: 09/24/2022 CLINICAL DATA:  Head trauma, moderate-severe  Syncopal episode, hit head. EXAM: CT HEAD WITHOUT CONTRAST TECHNIQUE: Contiguous axial images were obtained from the base of the skull through the vertex without intravenous contrast. RADIATION DOSE REDUCTION: This exam was performed according to the departmental dose-optimization program which includes automated exposure control, adjustment of the mA and/or kV according to patient size and/or use of iterative reconstruction technique. COMPARISON:  None Available. FINDINGS: Brain: No intracranial hemorrhage, mass effect, or midline shift. No hydrocephalus. The basilar cisterns are patent. No evidence of territorial infarct or acute ischemia. No extra-axial or intracranial fluid collection. Vascular: No hyperdense vessel or unexpected calcification. Skull: No fracture or focal lesion. Sinuses/Orbits:  No acute findings. Paranasal sinus mucosal thickening involving the ethmoid air cells and left greater than right maxillary sinus. Other: No large scalp hematoma. IMPRESSION: 1. No acute intracranial abnormality. No skull fracture. 2. Paranasal sinus mucosal thickening involving the ethmoid air cells and left greater than right maxillary sinus. Electronically Signed   By: Narda Rutherford M.D.   On: 09/24/2022 22:06    Procedures Procedures  Orthostatic Lying BP- Lying: 121/78 Pulse- Lying: 68 Orthostatic Sitting BP- Sitting: 118/85 Pulse- Sitting: 68 Orthostatic Standing at 0 minutes BP- Standing at 0 minutes: 128/87 Pulse- Standing at 0 minutes: 73  Medications Ordered in ED Medications  lactated ringers bolus 1,000 mL (0 mLs Intravenous Stopped 09/24/22 2251)  oxyCODONE (Oxy IR/ROXICODONE) immediate release tablet 5 mg (5 mg Oral Given 09/24/22 2203)  ondansetron (ZOFRAN) injection 4 mg (4 mg Intravenous Given 09/24/22 2203)    ED Course/ Medical Decision Making/ A&P                                Medical Decision Making Amount and/or Complexity of Data Reviewed Labs: ordered. Decision-making  details documented in ED Course. Radiology: ordered. Decision-making details documented in ED Course. ECG/medicine tests: ordered. Decision-making details documented in ED Course.  Risk Prescription drug management.   Medical Decision Making:   KYLYN BENKO is a 42 y.o. female who presented to the ED today with syncope detailed above.    Additional history discussed with patient's family/caregivers.  Complete initial physical exam performed, notably the patient was in no acute distress.  She had moderately dry mucous membranes.  Neurologically intact.  Abdomen soft and nontender.  No midline spinal tenderness.    Reviewed and confirmed nursing documentation for past medical history, family history, social history.    Initial Assessment:   With the patient's presentation, differential diagnosis includes but is not limited to orthostatic hypotension, vasovagal syncope, dehydration, ACS, arrhythmia, electrolyte disturbance, AKI, infectious process, head injury, ICH, disc herniation. This is most consistent with an acute complicated illness  Initial Plan:  Screening labs including CBC and Metabolic panel to evaluate for infectious or metabolic etiology of disease.  EKG and troponin to evaluate for cardiac pathology CT brain and C-spine to assess for traumatic injury Fluids to treat dehydration Orthostatic vital signs to assess orthostasis Objective evaluation as below reviewed   Initial Study Results:   Laboratory  All laboratory results reviewed without evidence of clinically relevant pathology.   Exceptions include: Creatinine 1.02, hemoglobin 10.3  EKG EKG was reviewed independently.  Normal sinus rhythm.  No acute ST-T changes.  No STEMI.  Radiology:  All images reviewed independently. Agree with radiology report at this time.   CT Cervical Spine Wo Contrast  Result Date: 09/24/2022 CLINICAL DATA:  Neck trauma, dangerous injury mechanism (Age 24-64y) Syncope leading to fall.  EXAM: CT CERVICAL SPINE WITHOUT CONTRAST TECHNIQUE: Multidetector CT imaging of the cervical spine was performed without intravenous contrast. Multiplanar CT image reconstructions were also generated. RADIATION DOSE REDUCTION: This exam was performed according to the departmental dose-optimization program which includes automated exposure control, adjustment of the mA and/or kV according to patient size and/or use of iterative reconstruction technique. COMPARISON:  None Available. FINDINGS: Alignment: Straightening of normal lordosis. No traumatic subluxation. Skull base and vertebrae: No acute fracture. Vertebral body heights are maintained. The dens and skull base are intact. Non fusion posterior arch of C1, variant anatomy. Soft tissues and spinal canal:  No prevertebral fluid or swelling. No visible canal hematoma. Disc levels: Anterior spurring C5-C6 and C6-C7. C7-C1 facet hypertrophy on the left. Upper chest: No acute findings. Other: None. 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 Electronically Signed   By: Narda Rutherford M.D.   On: 09/24/2022 22:09   CT Head Wo Contrast  Result Date: 09/24/2022 CLINICAL DATA:  Head trauma, moderate-severe Syncopal episode, hit head. EXAM: CT HEAD WITHOUT CONTRAST TECHNIQUE: Contiguous axial images were obtained from the base of the skull through the vertex without intravenous contrast. RADIATION DOSE REDUCTION: This exam was performed according to the departmental dose-optimization program which includes automated exposure control, adjustment of the mA and/or kV according to patient size and/or use of iterative reconstruction technique. COMPARISON:  None Available. FINDINGS: Brain: No intracranial hemorrhage, mass effect, or midline shift. No hydrocephalus. The basilar cisterns are patent. No evidence of territorial infarct or acute ischemia. No extra-axial or intracranial fluid collection. Vascular: No  hyperdense vessel or unexpected calcification. Skull: No fracture or focal lesion. Sinuses/Orbits: No acute findings. Paranasal sinus mucosal thickening involving the ethmoid air cells and left greater than right maxillary sinus. Other: No large scalp hematoma. IMPRESSION: 1. No acute intracranial abnormality. No skull fracture. 2. Paranasal sinus mucosal thickening involving the ethmoid air cells and left greater than right maxillary sinus. Electronically Signed   By: Narda Rutherford M.D.   On: 09/24/2022 22:06      Final Assessment and Plan:   42 year old female presents to the ED for evaluation following syncopal episode yesterday.  Notes hitting her head on concrete.  Complaining of a headache and neck pain.  Has tenderness over the right trapezius.  No midline spinal tenderness.  Neurologically intact.  Disorder dehydrated.  Vital signs reassuring.  Orthostatics negative.  Recommended she has bed for further assessment.  No acute EKG changes.  Normal troponin.  Low suspicion for ACS.  PERC negative.  No significant abnormalities on blood work.  CT brain without acute findings.  CT C-spine with degenerative changes but no acute findings either.  Suspect syncope secondary to dehydration.  Patient much improved following fluids and pain medication.  Discussed all findings and patient comfortable with discharge home and will follow-up outpatient as needed.  Will trial muscle relaxers at home given trapezius tenderness for symptomatic control.  Strict ED return precautions given, all questions answered, and stable for discharge.   Clinical Impression:  1. Syncope and collapse   2. Strain of neck muscle, initial encounter   3. Injury of head, initial encounter   4. Dehydration      Discharge           Final Clinical Impression(s) / ED Diagnoses Final diagnoses:  Strain of neck muscle, initial encounter  Injury of head, initial encounter  Syncope and collapse  Dehydration    Rx / DC  Orders ED Discharge Orders          Ordered    cyclobenzaprine (FLEXERIL) 10 MG tablet  3 times daily PRN        09/24/22 2338              Richardson Dopp 09/24/22 2347    Lorre Nick, MD 09/28/22 1745

## 2022-09-24 NOTE — ED Triage Notes (Signed)
Syncopal episode yesterday while out shopping. Says she hit her head after falling.   Intermittent episodes of dizziness.

## 2022-10-18 DIAGNOSIS — Z419 Encounter for procedure for purposes other than remedying health state, unspecified: Secondary | ICD-10-CM | POA: Diagnosis not present

## 2022-11-17 DIAGNOSIS — Z419 Encounter for procedure for purposes other than remedying health state, unspecified: Secondary | ICD-10-CM | POA: Diagnosis not present

## 2022-12-18 DIAGNOSIS — Z419 Encounter for procedure for purposes other than remedying health state, unspecified: Secondary | ICD-10-CM | POA: Diagnosis not present

## 2023-01-17 DIAGNOSIS — Z419 Encounter for procedure for purposes other than remedying health state, unspecified: Secondary | ICD-10-CM | POA: Diagnosis not present

## 2023-02-17 DIAGNOSIS — Z419 Encounter for procedure for purposes other than remedying health state, unspecified: Secondary | ICD-10-CM | POA: Diagnosis not present

## 2023-02-28 DIAGNOSIS — Z419 Encounter for procedure for purposes other than remedying health state, unspecified: Secondary | ICD-10-CM | POA: Diagnosis not present

## 2023-03-20 DIAGNOSIS — Z419 Encounter for procedure for purposes other than remedying health state, unspecified: Secondary | ICD-10-CM | POA: Diagnosis not present

## 2023-03-31 DIAGNOSIS — Z419 Encounter for procedure for purposes other than remedying health state, unspecified: Secondary | ICD-10-CM | POA: Diagnosis not present

## 2023-04-17 DIAGNOSIS — Z419 Encounter for procedure for purposes other than remedying health state, unspecified: Secondary | ICD-10-CM | POA: Diagnosis not present

## 2023-04-28 DIAGNOSIS — Z419 Encounter for procedure for purposes other than remedying health state, unspecified: Secondary | ICD-10-CM | POA: Diagnosis not present

## 2023-05-29 DIAGNOSIS — Z419 Encounter for procedure for purposes other than remedying health state, unspecified: Secondary | ICD-10-CM | POA: Diagnosis not present

## 2023-06-28 DIAGNOSIS — Z419 Encounter for procedure for purposes other than remedying health state, unspecified: Secondary | ICD-10-CM | POA: Diagnosis not present

## 2023-07-29 DIAGNOSIS — Z419 Encounter for procedure for purposes other than remedying health state, unspecified: Secondary | ICD-10-CM | POA: Diagnosis not present

## 2023-08-28 DIAGNOSIS — Z419 Encounter for procedure for purposes other than remedying health state, unspecified: Secondary | ICD-10-CM | POA: Diagnosis not present

## 2023-09-28 DIAGNOSIS — Z419 Encounter for procedure for purposes other than remedying health state, unspecified: Secondary | ICD-10-CM | POA: Diagnosis not present

## 2023-10-29 DIAGNOSIS — Z419 Encounter for procedure for purposes other than remedying health state, unspecified: Secondary | ICD-10-CM | POA: Diagnosis not present

## 2023-12-29 DIAGNOSIS — Z419 Encounter for procedure for purposes other than remedying health state, unspecified: Secondary | ICD-10-CM | POA: Diagnosis not present

## 2024-03-20 ENCOUNTER — Telehealth: Admitting: Emergency Medicine

## 2024-03-20 DIAGNOSIS — M545 Low back pain, unspecified: Secondary | ICD-10-CM | POA: Diagnosis not present

## 2024-03-20 DIAGNOSIS — R350 Frequency of micturition: Secondary | ICD-10-CM | POA: Diagnosis not present

## 2024-03-20 MED ORDER — CEPHALEXIN 500 MG PO CAPS: 500.0000 mg | ORAL_CAPSULE | Freq: Two times a day (BID) | ORAL | 0 refills | Status: AC

## 2024-03-20 NOTE — Progress Notes (Signed)
 " Virtual Visit Consent   Sarah Klein, you are scheduled for a virtual visit with a [facility] provider today. Just as with appointments in the office, your consent must be obtained to participate. Your consent will be active for this visit and any virtual visit you may have with one of our providers in the next 365 days. If you have a MyChart account, a copy of this consent can be sent to you electronically.  As this is a virtual visit, video technology does not allow for your provider to perform a traditional examination. This may limit your provider's ability to fully assess your condition. If your provider identifies any concerns that need to be evaluated in person or the need to arrange testing (such as labs, EKG, etc.), we will make arrangements to do so. Although advances in technology are sophisticated, we cannot ensure that it will always work on either your end or our end. If the connection with a video visit is poor, the visit may have to be switched to a telephone visit. With either a video or telephone visit, we are not always able to ensure that we have a secure connection.  By engaging in this virtual visit, you consent to the provision of healthcare and authorize for your insurance to be billed (if applicable) for the services provided during this visit. Depending on your insurance coverage, you may receive a charge related to this service.  I need to obtain your verbal consent now. Are you willing to proceed with your visit today? Sarah Klein has provided verbal consent on 03/20/2024 for a virtual visit (video or telephone). Jon CHRISTELLA Belt, NP  Date: 03/20/2024 2:29 PM   Virtual Visit via Video Note   I, Jon CHRISTELLA Belt, connected with  Sarah Klein  ([id number], 1980-03-18) on 03/20/24 at  2:15 PM EST by a video-enabled telemedicine application and verified that I am speaking with the correct person using two identifiers.  Location: Patient: Virtual Visit Location  Patient: Other: parked car in [place] Provider: Virtual Visit Location Provider: Home Office   I discussed the limitations of evaluation and management by telemedicine and the availability of in person appointments. The patient expressed understanding and agreed to proceed.    History of Present Illness: Sarah Klein is a 44 y.o. who identifies as a female who was assigned female at birth, and is being seen today for  possible UTI.   B Back pain lower x 2-3 days. Woke up with it one morning. Denies any activity that could have caused muscle injury (has not been shoveling snow).   Feels uncomfortable to stand up. Feels like prior UTIs, last UTI was a couple of years ago. No pain with palpation of back/flank, no change in sx with stretching. No fever, chills, vaginal discharge, n/v, hematuria.   Malodorous urine - smells strong.   Tylenol  did not help. Sx not getting worse, not getting better.   She had back pain encounter 3 years ago, treated with naproxen  and baclofen  - pt says this pain is much different, does not feel like muscle pain, and feels like prior uti  No pain radiating down legs, no change bowel/bladder habits.   HPI: HPI  Problems: There are no active problems to display for this patient.   Allergies: Allergies[1] Medications: Current Medications[2]  Observations/Objective: Patient is well-developed, well-nourished in no acute distress.  Resting comfortably  in parked car in [place].  Head is normocephalic, atraumatic.  No labored breathing.  Speech is clear and coherent with logical content.  Patient is alert and oriented at baseline.    Assessment and Plan: 1. Acute bilateral low back pain without sciatica (Primary)  2. Urinary frequency - cephALEXin  (KEFLEX ) 500 MG capsule; Take 1 capsule (500 mg total) by mouth 2 (two) times daily for 7 days.  Dispense: 14 capsule; Refill: 0  Sx are unusual for UTI but she has no external back pain and she feels strongly this is a  UTI. She does not have sx c/w pyelo. Discussed options with her. We can try tx for UTI but if not improving in about 36-48 hours or if worsening, will need in person evaluation  Follow Up Instructions: I discussed the assessment and treatment plan with the patient. The patient was provided an opportunity to ask questions and all were answered. The patient agreed with the plan and demonstrated an understanding of the instructions.  A copy of instructions were sent to the patient via MyChart unless otherwise noted below.    The patient was advised to call back or seek an in-person evaluation if the symptoms worsen or if the condition fails to improve as anticipated.    Jon CHRISTELLA Belt, NP     [1] No Known Allergies [2]  Current Outpatient Medications:    cephALEXin  (KEFLEX ) 500 MG capsule, Take 1 capsule (500 mg total) by mouth 2 (two) times daily for 7 days., Disp: 14 capsule, Rfl: 0  Current Facility-Administered Medications:    medroxyPROGESTERone  (DEPO-PROVERA ) injection 150 mg, 150 mg, Intramuscular, Q90 days, Fredirick Glenys RAMAN, MD, 150 mg at 06/16/13 1051  "
